# Patient Record
Sex: Male | Born: 1960 | Race: White | Hispanic: No | Marital: Married | Smoking: Never smoker
Health system: Southern US, Community
[De-identification: ages and names within clinical notes are randomized; demographics above are authoritative.]

## PROBLEM LIST (undated history)

## (undated) DIAGNOSIS — R269 Unspecified abnormalities of gait and mobility: Secondary | ICD-10-CM

## (undated) DIAGNOSIS — M545 Low back pain, unspecified: Secondary | ICD-10-CM

## (undated) DIAGNOSIS — M25551 Pain in right hip: Secondary | ICD-10-CM

## (undated) HISTORY — PX: HERNIA REPAIR: SHX51

## (undated) HISTORY — PX: FINGER SURGERY: SHX640

## (undated) HISTORY — DX: Pain in right hip: M25.551

## (undated) HISTORY — DX: Unspecified abnormalities of gait and mobility: R26.9

## (undated) HISTORY — PX: FOOT SURGERY: SHX648

## (undated) HISTORY — DX: Low back pain, unspecified: M54.50

---

## 2014-01-01 ENCOUNTER — Other Ambulatory Visit: Payer: Self-pay | Admitting: Unknown Physician Specialty

## 2014-01-01 DIAGNOSIS — R1032 Left lower quadrant pain: Secondary | ICD-10-CM

## 2014-11-05 ENCOUNTER — Emergency Department (INDEPENDENT_AMBULATORY_CARE_PROVIDER_SITE_OTHER)
Admission: EM | Admit: 2014-11-05 | Discharge: 2014-11-05 | Disposition: A | Discharge status: Home / Self Care | Payer: Worker's Compensation | Source: Home / Self Care | Attending: Family Medicine | Admitting: Family Medicine

## 2014-11-05 ENCOUNTER — Encounter: Payer: Self-pay | Admitting: Emergency Medicine

## 2014-11-05 DIAGNOSIS — S39012A Strain of muscle, fascia and tendon of lower back, initial encounter: Secondary | ICD-10-CM | POA: Diagnosis not present

## 2014-11-05 NOTE — ED Provider Notes (Signed)
CSN: [id number]     Arrival date & time 11/05/14  1131 History   First MD Initiated Contact with Patient 11/05/14 1138     Chief Complaint  Patient presents with  . 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  Pt states he felt a tightness and throbbing pain in his Right lower back. Pain is sharp on occasion with certain movements and positions, 8/10 at worst.  He has used Icy-hot and ibuprofen which has provided moderate relief. Pt is concerned he already feels pain as he states he does not normally get pain until a day or two after strenuous work. Denies hx of prior back surgeries. Denies any other injuries. Denies numbness or tingling in arms or legs. Denies change in bowel or bladder habits.  History reviewed. No pertinent past medical history. Past Surgical History  Procedure Laterality Date  . Hernia repair    . Finger surgery    . Foot surgery     Family History  Problem Relation Age of Onset  . Hyperlipidemia Father   . Dementia Father    Social History  Substance Use Topics  . Smoking status: Never Smoker   . Smokeless tobacco: None  . Alcohol Use: No    Review of Systems  Musculoskeletal: Positive for myalgias and back pain. Negative for arthralgias and gait problem.  Skin: Negative for rash and wound.  Neurological: Negative for weakness and numbness.    Allergies  Review of patient's allergies indicates no known allergies.  Home Medications   Prior to Admission medications   Medication Sig Start Date End Date Taking? Authorizing Provider  ibuprofen (ADVIL,MOTRIN) 200 MG tablet Take 200 mg by mouth every 6 (six) hours as needed.   Yes Historical Provider, MD   Meds Ordered and Administered this Visit  Medications - No data to  display  BP 121/71 mmHg  Pulse 55  Temp(Src) 97.9 F (36.6 C) (Oral)  Ht 5\' 9"  (1.753 m)  Wt 189 lb (85.73 kg)  BMI 27.90 kg/m2  SpO2 99% No data found.   Physical Exam  Constitutional: He is oriented to person, place, and time. He appears well-developed and well-nourished.  HENT:  Head: Normocephalic and atraumatic.  Eyes: EOM are normal.  Neck: Normal range of motion.  Cardiovascular: Normal rate.   Pulmonary/Chest: Effort normal.  Abdominal: Soft. There is no tenderness.  Musculoskeletal: Normal range of motion. He exhibits tenderness. He exhibits no edema.       Back:  No midline spinal tenderness. Tenderness to Right lower lumbar muscles. FROM upper and lower extremities bilaterally with 5/5 strength. Increased pain with full rotation to Left and Right at the waist. Increased pain with full flexion at the waist.  Neurological: He is alert and oriented to person, place, and time.  Normal gait. Normal sensation in all extremities.  Skin: Skin is warm and dry. No rash noted. No erythema.  Psychiatric: He has a normal mood and affect. His behavior is normal.  Nursing note and vitals reviewed.   ED Course  Procedures (including critical care time)  Labs Review Labs Reviewed - No data to display  Imaging Review No results found.   MDM   1. Low back strain, initial encounter    Pt c/o Right lower back pain after lifting a heavy object at work.  No red flag symptoms. No bony tenderness. Muscle strain. Pt states ibuprofen has provided moderate relief. Encouraged rest, ice, and gentle stretching as tolerated. Discouraged lifting over 25lbs and discouraged sudden bending or twisting at the waist. F/u in 2 days for recheck of symptoms. Work note provided for pt to be off until f/u on Friday 9/16. Pt verbalized understanding and agreement with tx plan.   Junius Finner, PA-C 11/05/14 [phone]

## 2014-11-05 NOTE — Discharge Instructions (Signed)
You may take 600-800mg  Ibuprofen every 6-8 hours for pain and swelling.  Please use ice on your lower back today 3-4 times a day for 15-20 minutes at a time.  You may alternate heat if that feels more comfortable. Avoid heavy lifting over 25 pounds. Avoid sudden rotation or bending at the waist to help prevent worsening your back pain. See below for further instructions.

## 2014-11-05 NOTE — ED Notes (Signed)
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

## 2014-11-07 ENCOUNTER — Emergency Department (INDEPENDENT_AMBULATORY_CARE_PROVIDER_SITE_OTHER)
Admission: EM | Admit: 2014-11-07 | Discharge: 2014-11-07 | Disposition: A | Discharge status: Home / Self Care | Payer: Worker's Compensation | Source: Home / Self Care | Attending: Family Medicine | Admitting: Family Medicine

## 2014-11-07 DIAGNOSIS — S39012D Strain of muscle, fascia and tendon of lower back, subsequent encounter: Secondary | ICD-10-CM

## 2014-11-07 NOTE — Discharge Instructions (Signed)
Apply ice pack for 20 to 30 minutes, 3 to 4 times daily  Continue until pain decreases.  Continue back exercises.  May begin swimming if tolerated.  Continue Ibuprofen , 3 to 4 tabs every 6 to 8 hours with food.    Back Pain, Adult Low back pain is very common. About 1 in 5 people have back pain.The cause of low back pain is rarely dangerous. The pain often gets better over time.About half of people with a sudden onset of back pain feel better in just 2 weeks. About 8 in 10 people feel better by 6 weeks.  CAUSES Some common causes of back pain include:  Strain of the muscles or ligaments supporting the spine.  Wear and tear (degeneration) of the spinal discs.  Arthritis.  Direct injury to the back. DIAGNOSIS Most of the time, the direct cause of low back pain is not known.However, back pain can be treated effectively even when the exact cause of the pain is unknown.Answering your caregiver's questions about your overall health and symptoms is one of the most accurate ways to make sure the cause of your pain is not dangerous. If your caregiver needs more information, he or she may order lab work or imaging tests (X-rays or MRIs).However, even if imaging tests show changes in your back, this usually does not require surgery. HOME CARE INSTRUCTIONS For many people, back pain returns.Since low back pain is rarely dangerous, it is often a condition that people can learn to [facility] their own.   Remain active. It is stressful on the back to sit or stand in one place. Do not sit, drive, or stand in one place for more than 30 minutes at a time. Take short walks on level surfaces as soon as pain allows.Try to increase the length of time you walk each day.  Do not stay in bed.Resting more than 1 or 2 days can delay your recovery.  Do not avoid exercise or work.Your body is made to move.It is not dangerous to be active, even though your back may hurt.Your back will likely heal faster if  you return to being active before your pain is gone.  Pay attention to your body when you bend and lift. Many people have less discomfortwhen lifting if they bend their knees, keep the load close to their bodies,and avoid twisting. Often, the most comfortable positions are those that put less stress on your recovering back.  Find a comfortable position to sleep. Use a firm mattress and lie on your side with your knees slightly bent. If you lie on your back, put a pillow under your knees.  Only take over-the-counter or prescription medicines as directed by your caregiver. Over-the-counter medicines to reduce pain and inflammation are often the most helpful.Your caregiver may prescribe muscle relaxant drugs.These medicines help dull your pain so you can more quickly return to your normal activities and healthy exercise.  Put ice on the injured area.  Put ice in a plastic bag.  Place a towel between your skin and the bag.  Leave the ice on for 15-20 minutes, 03-04 times a day for the first 2 to 3 days. After that, ice and heat may be alternated to reduce pain and spasms.  Ask your caregiver about trying back exercises and gentle massage. This may be of some benefit.  Avoid feeling anxious or stressed.Stress increases muscle tension and can worsen back pain.It is important to recognize when you are anxious or stressed and learn ways to  manage it.Exercise is a great option. SEEK MEDICAL CARE IF:  You have pain that is not relieved with rest or medicine.  You have pain that does not improve in 1 week.  You have new symptoms.  You are generally not feeling well. SEEK IMMEDIATE MEDICAL CARE IF:   You have pain that radiates from your back into your legs.  You develop new bowel or bladder control problems.  You have unusual weakness or numbness in your arms or legs.  You develop nausea or vomiting.  You develop abdominal pain.  You feel faint. Document Released: 02/07/2005  Document Revised: 08/09/2011 Document Reviewed: 06/11/2013 [facility] Patient Information 2015 [employer], [place]. This information is not intended to replace advice given to you by your health care provider. Make sure you discuss any questions you have with your health care provider.

## 2014-11-07 NOTE — ED Provider Notes (Signed)
CSN: [id number]     Arrival date & time 11/07/14  8295 History   First MD Initiated Contact with Patient 11/07/14 [phone]     Chief Complaint  Patient presents with  . Follow-up      HPI Comments: Patient returns for follow-up of low back pain.  He reports that there is less pain in his right lower back, although he has a constant dull throbbing discomfort.  He still has pain when he bends over to tie shoes or climb into his car.   He denies bowel or bladder dysfunction, and no saddle numbness. He is normally quite physically active and wonders if he can begin swimming.  Patient is a 54 y.o. male presenting with back pain. The history is provided by the patient.  Back Pain Location:  Lumbar spine Quality:  Aching Radiates to:  Does not radiate Pain severity:  Mild Pain is:  Worse during the day Onset quality:  Sudden Duration:  2 days Timing:  Constant Progression:  Improving Chronicity:  New Context: occupational injury   Relieved by:  NSAIDs Worsened by:  Bending Associated symptoms: no abdominal pain, no bladder incontinence, no bowel incontinence, no chest pain, no dysuria, no fever, no leg pain, no numbness, no paresthesias, no pelvic pain, no perianal numbness, no tingling and no weakness     History reviewed. No pertinent past medical history. Past Surgical History  Procedure Laterality Date  . Hernia repair    . Finger surgery    . Foot surgery     Family History  Problem Relation Age of Onset  . Hyperlipidemia Father   . Dementia Father   . Hyperlipidemia Sister    Social History  Substance Use Topics  . Smoking status: Never Smoker   . Smokeless tobacco: None  . Alcohol Use: No    Review of Systems  Constitutional: Negative for fever.  Cardiovascular: Negative for chest pain.  Gastrointestinal: Negative for abdominal pain and bowel incontinence.  Genitourinary: Negative for bladder incontinence, dysuria and pelvic pain.  Musculoskeletal: Positive for back  pain.  Neurological: Negative for tingling, weakness, numbness and paresthesias.  All other systems reviewed and are negative.   Allergies  Review of patient's allergies indicates no known allergies.  Home Medications   Prior to Admission medications   Medication Sig Start Date End Date Taking? Authorizing Provider  ibuprofen (ADVIL,MOTRIN) 200 MG tablet Take 200 mg by mouth every 6 (six) hours as needed.    Historical Provider, MD   Meds Ordered and Administered this Visit  Medications - No data to display  BP 120/70 mmHg  Pulse 66  Temp(Src) 98 F (36.7 C) (Oral)  Ht 5\' 9"  (1.753 m)  Wt 189 lb (85.73 kg)  BMI 27.90 kg/m2  SpO2 97% No data found.   Physical Exam  Constitutional: He is oriented to person, place, and time. He appears well-developed and well-nourished. No distress.  HENT:  Head: Normocephalic.  Mouth/Throat: Oropharynx is clear and moist.  Eyes: Conjunctivae are normal. Pupils are equal, round, and reactive to light.  Neck: Normal range of motion.  Cardiovascular: Normal heart sounds.   Pulmonary/Chest: Breath sounds normal.  Abdominal: There is no tenderness.  Musculoskeletal:       Lumbar back: He exhibits decreased range of motion and tenderness. He exhibits no bony tenderness and no swelling.       Back:  Back:   Can heel/toe walk and squat without difficulty.  Decreased forward flexion.  Tenderness in the midline  and bilateral paraspinous muscles from L1 to Sacral area as noted on diagram.    Straight leg raising test is negative.  Sitting knee extension test is negative.  Strength and sensation in the lower extremities is normal.  Patellar and achilles reflexes are normal     Neurological: He is alert and oriented to person, place, and time. He has normal reflexes.  Skin: Skin is warm and dry. No rash noted.  Nursing note and vitals reviewed.   ED Course  Procedures  None    MDM   1. Low back strain, subsequent encounter     Apply ice  pack for 20 to 30 minutes, 3 to 4 times daily  Continue until pain decreases.  Continue back exercises.  May begin swimming if tolerated.  Continue Ibuprofen , 3 to 4 tabs every 6 to 8 hours with food.   Work Restrictions:  Remain out of work through 11/12/14.  Return for follow-up on 11/12/14.    Lattie Haw, MD 11/11/14 1025

## 2014-11-07 NOTE — ED Notes (Signed)
Here Wednesday for workers comp.  Pain is less in lower right back, now a dull throb most of the time. Still has sharp pain when bending over to tie shoes or lifting leg to get in the car.   Some of the recommended exercises work others did not.

## 2014-11-12 ENCOUNTER — Encounter: Payer: Self-pay | Admitting: *Deleted

## 2014-11-12 ENCOUNTER — Emergency Department (INDEPENDENT_AMBULATORY_CARE_PROVIDER_SITE_OTHER)
Admission: EM | Admit: 2014-11-12 | Discharge: 2014-11-12 | Disposition: A | Discharge status: Home / Self Care | Payer: Worker's Compensation | Source: Home / Self Care | Attending: Family Medicine | Admitting: Family Medicine

## 2014-11-12 DIAGNOSIS — S39012D Strain of muscle, fascia and tendon of lower back, subsequent encounter: Secondary | ICD-10-CM | POA: Diagnosis not present

## 2014-11-12 MED ORDER — LUMBAR BACK BRACE/SUPPORT PAD MISC: Status: DC

## 2014-11-12 NOTE — Discharge Instructions (Signed)
Continue back exercises.  Continue daily walking on flat surfaces.  May continue swimming.  May continue to apply ice pack and/or heat.   Return to work 11/17/14 (then avoid lifting for one week).

## 2014-11-12 NOTE — ED Notes (Signed)
Joel Parsons is here today for a follow up on his low back strain workers comp injury from 11/05/14. He reports that he "feels better". Pain is a 1-2 out of 10.

## 2014-11-12 NOTE — ED Provider Notes (Signed)
CSN: [id number]     Arrival date & time 11/12/14  4098 History   First MD Initiated Contact with Patient 11/12/14 1012     Chief Complaint  Patient presents with  . Follow-up      HPI Comments: Patient returns for follow-up of low back strain (worker's compensation injury).  He reports less pain but still has limited mobility when trying to bend over.  No radicular symptoms.  He has started swimming without difficulty, continues daily back exercises, and walks daily.  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  Patient is a 54 y.o. male presenting with back pain. The history is provided by the patient.  Back Pain Location:  Lumbar spine Quality:  Aching Radiates to:  Does not radiate Pain severity:  Mild Pain is:  Worse during the day Onset quality:  Sudden Duration:  1 week Timing:  Intermittent Progression:  Improving Worsened by:  Bending Associated symptoms: no abdominal pain, no bladder incontinence, no bowel incontinence, no leg pain, no numbness, no paresthesias, no perianal numbness, no tingling, no weakness and no weight loss     History reviewed. No pertinent past medical history. Past Surgical History  Procedure Laterality Date  . Hernia repair    . Finger surgery    . Foot surgery     Family History  Problem Relation Age of Onset  . Hyperlipidemia Father   . Dementia Father   . Hyperlipidemia Sister    Social History  Substance Use Topics  . Smoking status: Never Smoker   . Smokeless tobacco: None  . Alcohol Use: No    Review of Systems  Constitutional: Negative for weight loss.  Gastrointestinal: Negative for abdominal pain and bowel incontinence.  Genitourinary: Negative for bladder incontinence.  Musculoskeletal: Positive for back pain.  Neurological: Negative for tingling, weakness, numbness and paresthesias.  All other systems reviewed and are negative.   Allergies  Review of patient's allergies  indicates no known allergies.  Home Medications   Prior to Admission medications   Medication Sig Start Date End Date Taking? Authorizing Provider  Elastic Bandages & Supports (LUMBAR BACK BRACE/SUPPORT PAD) MISC Wear daily wihile at work for one week (Worker's compensation injury) 11/12/14   Lattie Haw, MD  ibuprofen (ADVIL,MOTRIN) 200 MG tablet Take 200 mg by mouth every 6 (six) hours as needed.    Historical Provider, MD   Meds Ordered and Administered this Visit  Medications - No data to display  BP 105/66 mmHg  Pulse 66  Temp(Src) 98 F (36.7 C) (Oral)  Resp 16  Ht 5' 9.5" (1.765 m)  Wt 185 lb (83.915 kg)  BMI 26.94 kg/m2  SpO2 97% No data found.   Physical Exam  Constitutional: He is oriented to person, place, and time. He appears well-developed and well-nourished. No distress.  HENT:  Head: Normocephalic.  Eyes: Pupils are equal, round, and reactive to light.  Musculoskeletal:       Lumbar back: He exhibits decreased range of motion and tenderness.       Back:  Back:  Improved range of motion.  Mild tenderness in the midline and bilateral paraspinous muscles from L1 to Sacral area.  Straight leg raising test is negative.  Sitting knee extension test is negative.  Strength and sensation in the lower extremities is normal.  Patellar and achilles reflexes are normal   Neurological: He is alert and oriented to person, place, and time.  Skin: Skin  is warm and dry.  Nursing note and vitals reviewed.   ED Course  Procedures  None    MDM   1. Low back strain, subsequent encounter; improving.    Rx written for lumbar support. Continue back exercises.  Continue daily walking on flat surfaces.  May continue swimming.  May continue to apply ice pack and/or heat.   Return to work 11/17/14 (then avoid lifting for one week). Return for follow-up on 11/24/2014    Lattie Haw, MD 11/12/14 1047

## 2014-11-21 ENCOUNTER — Emergency Department (INDEPENDENT_AMBULATORY_CARE_PROVIDER_SITE_OTHER)
Admission: EM | Admit: 2014-11-21 | Discharge: 2014-11-21 | Disposition: A | Discharge status: Home / Self Care | Payer: Worker's Compensation | Source: Home / Self Care | Attending: Family Medicine | Admitting: Family Medicine

## 2014-11-21 DIAGNOSIS — S39012D Strain of muscle, fascia and tendon of lower back, subsequent encounter: Secondary | ICD-10-CM

## 2014-11-21 NOTE — ED Provider Notes (Signed)
CSN: [id number]     Arrival date & time 11/21/14  1119 History   First MD Initiated Contact with Patient 11/21/14 1152     Chief Complaint  Patient presents with  . Back Pain    lower   (Consider location/radiation/quality/duration/timing/severity/associated sxs/prior Treatment) HPI  Pt is a 54yo male sent to [facility] by his employer for recheck of lower back pain.  Pt was seen on 11/05/14 for initial injury, seen again on 11/07/14 and on 11/12/14. Pt returned to work with light duty, no heavy lifting, on Monday, 11/17/14.  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 He denies heavy lifting or any squatting but states his lower back started hurting again yesterday around 10AM.  Pain is aching and throbbing, 7/10 at worst. He did take ibuprofen yesterday with relief but was told to come be evaluated again today as his f/u with Employee Health was not scheduled until Monday, 11/24/14.  He did not take ibuprofen today as he wanted to "see how he did"  He has been alternating ice and heat and also does gentle stretching and home exercises.  He also bought a lumbar support brace he has been using intermittently as instructed by Dr. Cathren Harsh. Denies new injuries. Denies numbness or tingling in groin or legs. Denies change in bowel or bladder habits.  Pt does note his father had several lumbar discs fussed when he was about same age as the pt. Pt denies prior back surgeries but is concerned he has a herniated disc.   History reviewed. No pertinent past medical history. Past Surgical History  Procedure Laterality Date  . Hernia repair    . Finger surgery    . Foot surgery     Family History  Problem Relation Age of Onset  . Hyperlipidemia Father   . Dementia Father   . Hyperlipidemia Sister    Social History  Substance Use Topics  . Smoking status: Never Smoker   . Smokeless tobacco: None  . Alcohol Use: No    Review of Systems  Genitourinary:  Negative for dysuria and hematuria.  Musculoskeletal: Positive for myalgias and back pain. Negative for gait problem, neck pain and neck stiffness.  Skin: Negative for color change, rash and wound.  Neurological: Negative for weakness and numbness.    Allergies  Review of patient's allergies indicates no known allergies.  Home Medications   Prior to Admission medications   Medication Sig Start Date End Date Taking? Authorizing Provider  Elastic Bandages & Supports (LUMBAR BACK BRACE/SUPPORT PAD) MISC Wear daily wihile at work for one week (Worker's compensation injury) 11/12/14   Lattie Haw, MD  ibuprofen (ADVIL,MOTRIN) 200 MG tablet Take 200 mg by mouth every 6 (six) hours as needed.    Historical Provider, MD   Meds Ordered and Administered this Visit  Medications - No data to display  BP 117/72 mmHg  Pulse 60  Temp(Src) 98.2 F (36.8 C) (Oral)  Ht 5' 9.5" (1.765 m)  Wt 188 lb 6.4 oz (85.458 kg)  BMI 27.43 kg/m2  SpO2 97% No data found.   Physical Exam  Constitutional: He is oriented to person, place, and time. He appears well-developed and well-nourished.  HENT:  Head: Normocephalic and atraumatic.  Eyes: EOM are normal.  Neck: Normal range of motion.  Cardiovascular: Normal rate.   Pulmonary/Chest: Effort normal.  Musculoskeletal: Normal range of motion. He exhibits tenderness. He exhibits no edema.  No  midline spinal tenderness. Full ROM upper and lower extremities bilaterally with 5/5 strength bilaterally. Mild tenderness to Right lower lumbar muscles.  Neurological: He is alert and oriented to person, place, and time.  Reflex Scores:      Patellar reflexes are 2+ on the right side and 2+ on the left side.      Achilles reflexes are 2+ on the right side and 2+ on the left side. Normal gait.  Skin: Skin is warm and dry.  Psychiatric: He has a normal mood and affect. His behavior is normal.  Nursing note and vitals reviewed.   ED Course  Procedures  (including critical care time)  Labs Review Labs Reviewed - No data to display  Imaging Review No results found.    MDM   1. Low back strain, subsequent encounter    Pt c/o continued lower back pain secondary to initial injury on 11/05/14 at work.  He has been using conservative treatment including ice, heat, ibuprofen, lumbar brace, and home exercises. Pt does have mild tenderness in lumbar muscles today. No red flag symptoms. Pain is 1/10 at rest but 7/10 at worst while at work yesterday. Work note provided for pt to be off work today, he is off this weekend. Encouraged him to f/u with Employee health as previously planned for 11/24/14 for further evaluation and treatment. Patient verbalized understanding and agreement with treatment plan.   Junius Finner, PA-C 11/21/14 1203

## 2014-11-21 NOTE — ED Notes (Signed)
Still having back discomfort from previous injury.   Workers comp, stated that went back to work Monday with the restriction of no heavy lifting, Monday, Tuesday, and Wednesday, were good.  Back started hurting Thursday around 10-11.  Lower back pain, throbbing but not constant.

## 2016-11-07 ENCOUNTER — Ambulatory Visit (INDEPENDENT_AMBULATORY_CARE_PROVIDER_SITE_OTHER): Payer: Self-pay

## 2016-11-07 ENCOUNTER — Other Ambulatory Visit: Payer: Self-pay | Admitting: Emergency Medicine

## 2016-11-07 DIAGNOSIS — M795 Residual foreign body in soft tissue: Secondary | ICD-10-CM

## 2016-11-07 DIAGNOSIS — X58XXXA Exposure to other specified factors, initial encounter: Secondary | ICD-10-CM

## 2016-11-07 DIAGNOSIS — S81812A Laceration without foreign body, left lower leg, initial encounter: Secondary | ICD-10-CM

## 2017-04-25 ENCOUNTER — Other Ambulatory Visit: Payer: Self-pay | Admitting: Unknown Physician Specialty

## 2017-04-25 DIAGNOSIS — R109 Unspecified abdominal pain: Secondary | ICD-10-CM

## 2017-04-27 ENCOUNTER — Ambulatory Visit (INDEPENDENT_AMBULATORY_CARE_PROVIDER_SITE_OTHER): Payer: PRIVATE HEALTH INSURANCE

## 2017-04-27 DIAGNOSIS — R109 Unspecified abdominal pain: Secondary | ICD-10-CM

## 2018-12-11 ENCOUNTER — Ambulatory Visit (INDEPENDENT_AMBULATORY_CARE_PROVIDER_SITE_OTHER): Payer: PRIVATE HEALTH INSURANCE

## 2018-12-11 ENCOUNTER — Other Ambulatory Visit: Payer: Self-pay | Admitting: Unknown Physician Specialty

## 2018-12-11 ENCOUNTER — Other Ambulatory Visit: Payer: Self-pay

## 2018-12-11 DIAGNOSIS — R52 Pain, unspecified: Secondary | ICD-10-CM

## 2021-02-15 IMAGING — DX DG CERVICAL SPINE COMPLETE 4+V
6 series · 6 of 6 positions shown · non-contrast
Comparison: None.

CLINICAL DATA: Neck pain increasing over the past several months

EXAM:
CERVICAL SPINE - COMPLETE 4+ VIEW

[c-spine lat]
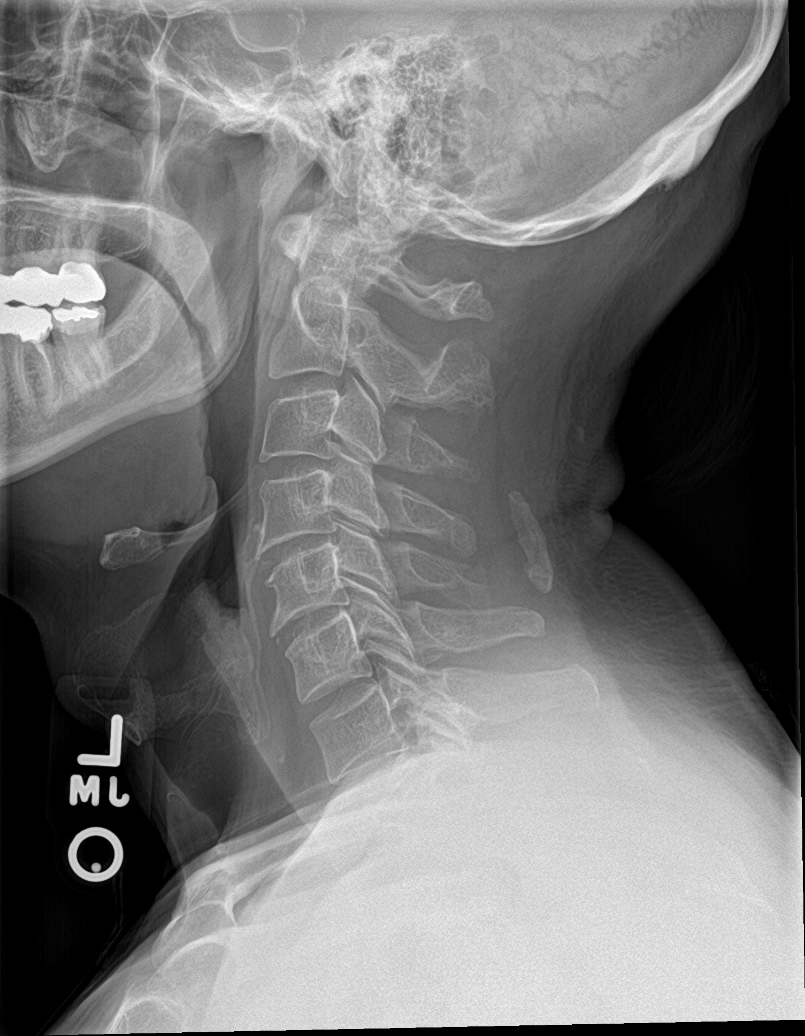

[c-spine obl (1 of 2)]
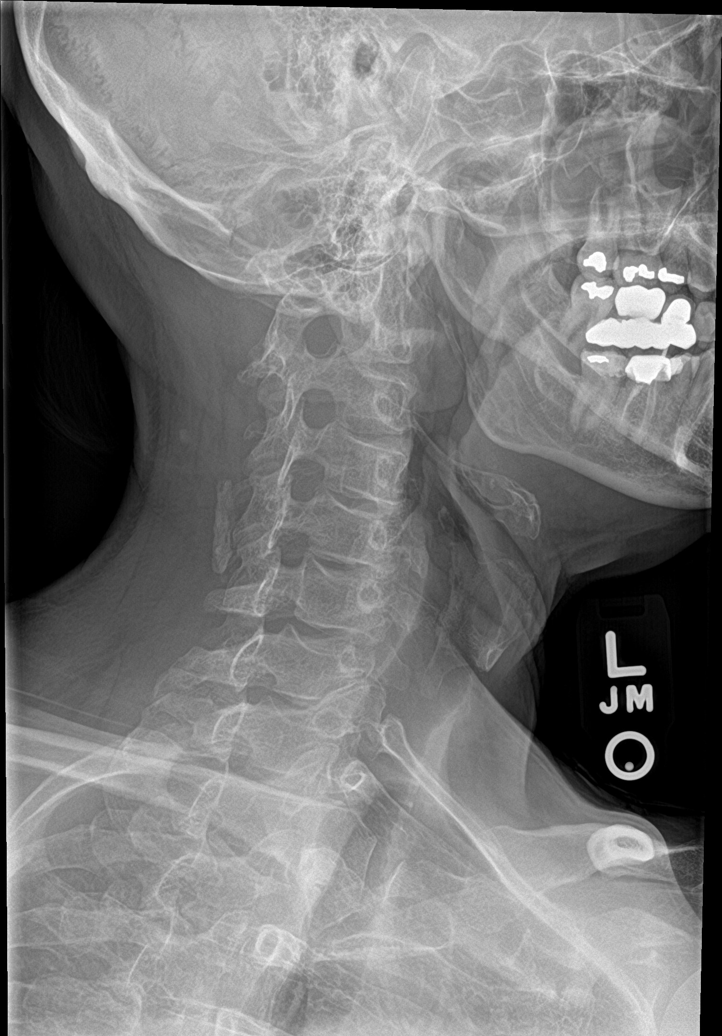

[c-spine obl (2 of 2)]
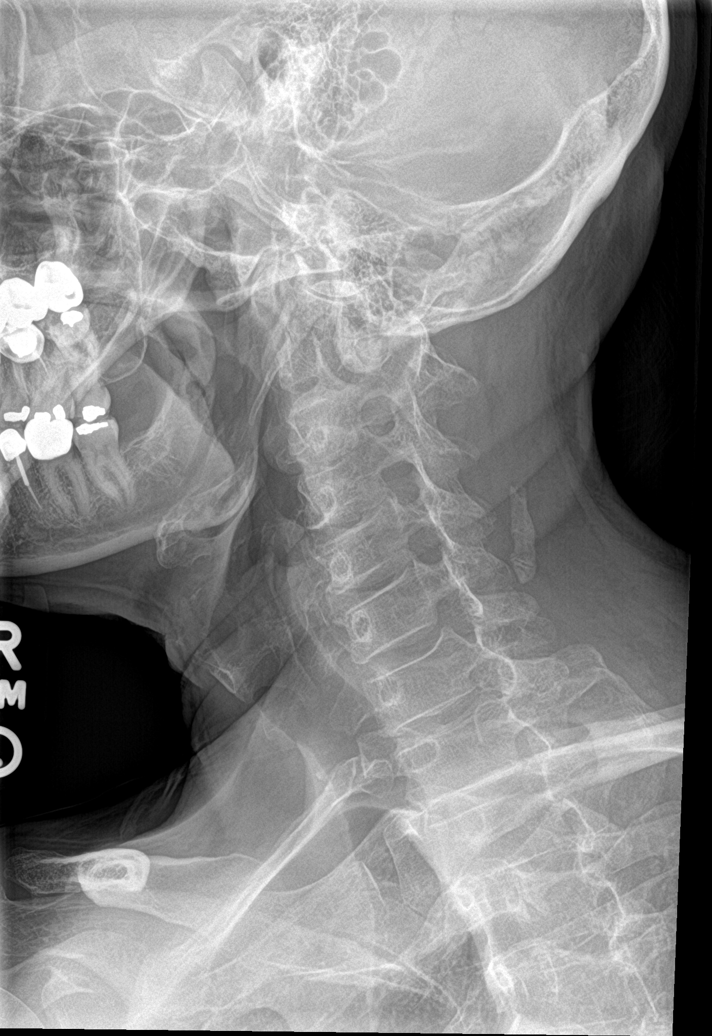

[c-spine ap]
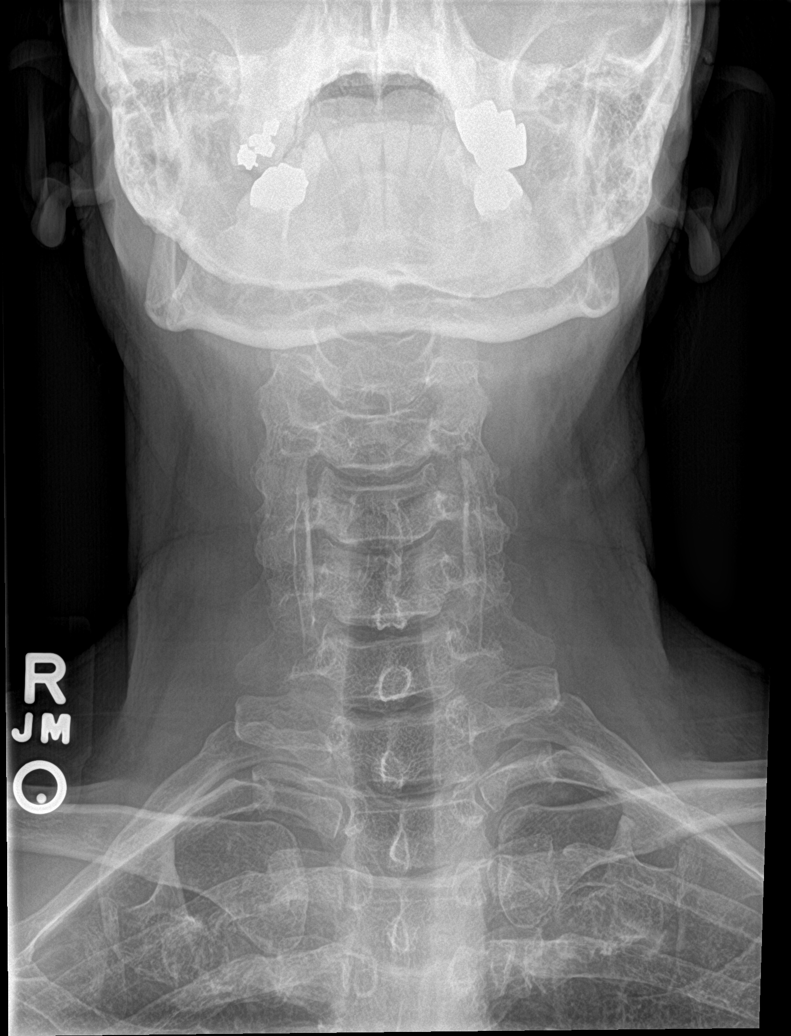

[c-spine open mouth]
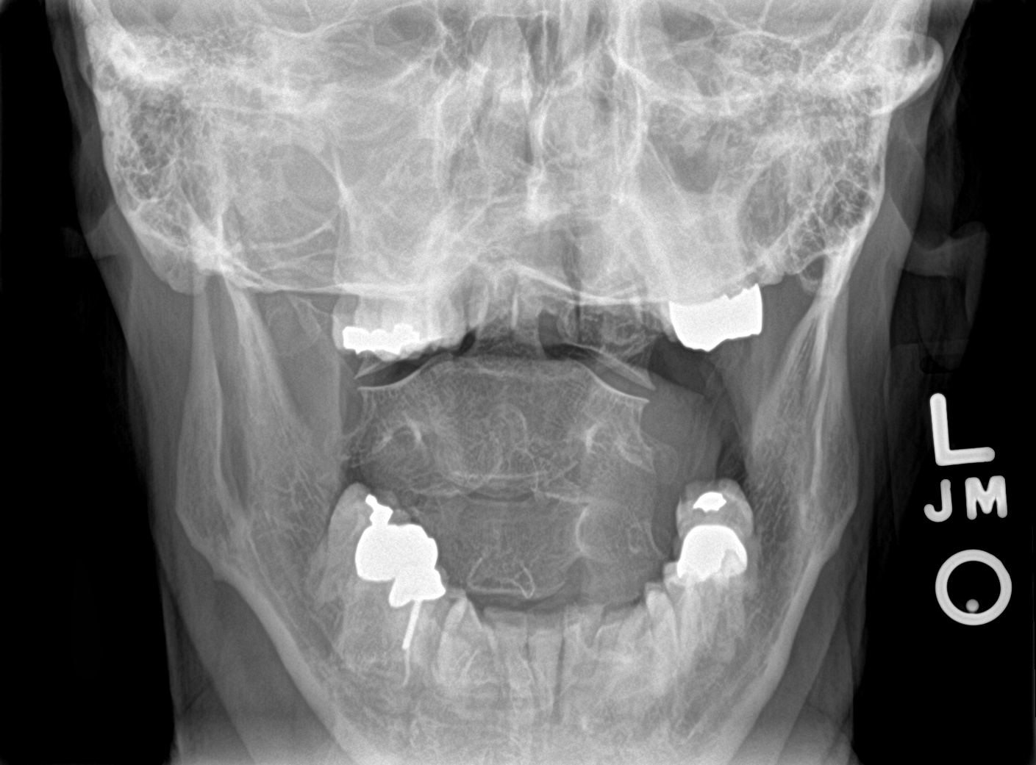

[[person_name]]
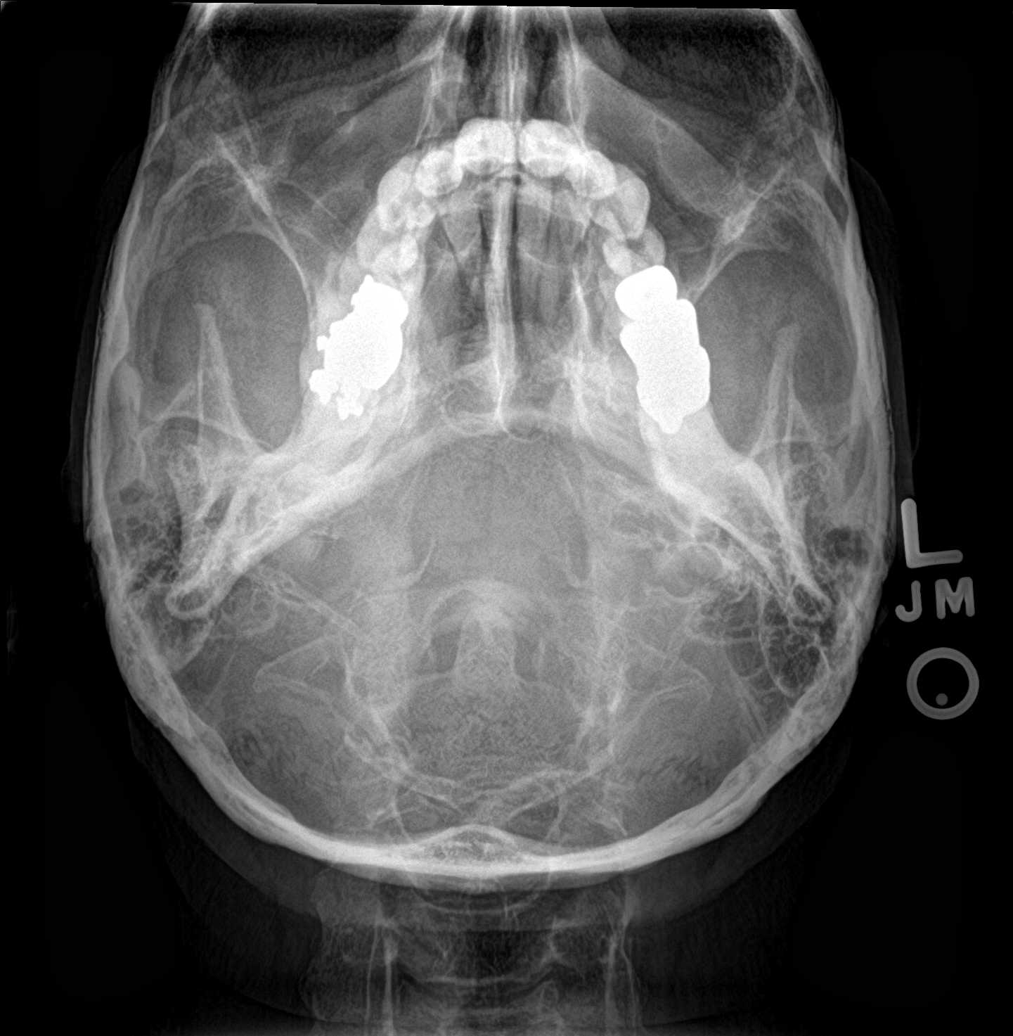

[6 of 6 positions shown; findings below may reference images not displayed]

FINDINGS: Seven cervical segments are well visualized. Vertebral body height
is well maintained. Mild osteophytic changes are noted. No
prevertebral soft tissues changes are seen. No neural foraminal
narrowing is noted. Mild facet hypertrophic changes are seen. The
odontoid is within normal limits.
IMPRESSION: Mild degenerative change without acute abnormality.

## 2022-02-19 ENCOUNTER — Ambulatory Visit
Admission: EM | Admit: 2022-02-19 | Discharge: 2022-02-19 | Disposition: A | Discharge status: Home / Self Care | Payer: BC Managed Care – PPO | Attending: Family Medicine | Admitting: Family Medicine

## 2022-02-19 ENCOUNTER — Other Ambulatory Visit: Payer: Self-pay

## 2022-02-19 ENCOUNTER — Ambulatory Visit: Admit: 2022-02-19 | Discharge status: Absent without leave | Payer: PRIVATE HEALTH INSURANCE

## 2022-02-19 DIAGNOSIS — S39012A Strain of muscle, fascia and tendon of lower back, initial encounter: Secondary | ICD-10-CM | POA: Diagnosis not present

## 2022-02-19 LAB — POCT URINALYSIS DIP (MANUAL ENTRY)
Bilirubin, UA: NEGATIVE
Blood, UA: NEGATIVE
Glucose, UA: NEGATIVE mg/dL
Leukocytes, UA: NEGATIVE
Nitrite, UA: NEGATIVE
Protein Ur, POC: NEGATIVE mg/dL
Spec Grav, UA: 1.015 (ref 1.010–1.025)
Urobilinogen, UA: 0.2 E.U./dL
pH, UA: 6.5 (ref 5.0–8.0)

## 2022-02-19 NOTE — Discharge Instructions (Addendum)
Advised patient may take OTC Ibuprofen 600 to 800 mg once daily, as needed for the next 2 to 3 days for lumbar strain.  Advised if symptoms worsen and/or unresolved please follow-up with PCP for further evaluation.

## 2022-02-19 NOTE — ED Provider Notes (Signed)
[facility]    CSN: [id number] Arrival date & time: 02/19/22  1116      History   Chief Complaint Chief Complaint  Patient presents with   Back Pain    Lower LT    HPI Joel Parsons is a 61 y.o. male.   HPI 61 year old male presents with left-sided back pain since yesterday radiates towards front denies any nausea and vomiting.  Patient is concerned for kidney stone.  History reviewed. No pertinent past medical history.  There are no problems to display for this patient.   Past Surgical History:  Procedure Laterality Date   FINGER SURGERY     FOOT SURGERY     HERNIA REPAIR         Home Medications    Prior to Admission medications   Medication Sig Start Date End Date Taking? Authorizing Provider  Elastic Bandages & Supports (LUMBAR BACK BRACE/SUPPORT PAD) MISC Wear daily wihile at work for one week (Worker's compensation injury) 11/12/14   Lattie Haw, MD  ibuprofen (ADVIL,MOTRIN) 200 MG tablet Take 200 mg by mouth every 6 (six) hours as needed.    [provider]    Family History Family History  Problem Relation Age of Onset   Hyperlipidemia Father    Dementia Father    Hyperlipidemia Sister     Social History Social History   Tobacco Use   Smoking status: Never  Substance Use Topics   Alcohol use: No   Drug use: No     Allergies   Patient has no known allergies.   Review of Systems Review of Systems   Physical Exam Triage Vital Signs ED Triage Vitals  Enc Vitals Group     BP 02/19/22 1323 114/75     Pulse Rate 02/19/22 1323 89     Resp 02/19/22 1323 17     Temp 02/19/22 1323 98.1 F (36.7 C)     Temp Source 02/19/22 1323 Oral     SpO2 02/19/22 1323 98 %     Weight --      Height --      Head Circumference --      Peak Flow --      Pain Score 02/19/22 1325 5     Pain Loc --      Pain Edu? --      Excl. in GC? --    No data found.  Updated Vital Signs BP 114/75 (BP Location: Right Arm)   Pulse  89   Temp 98.1 F (36.7 C) (Oral)   Resp 17   SpO2 98%   Visual Acuity Right Eye Distance:   Left Eye Distance:   Bilateral Distance:    Right Eye Near:   Left Eye Near:    Bilateral Near:     Physical Exam Vitals and nursing note reviewed.  Constitutional:      Appearance: Normal appearance. He is normal weight.  HENT:     Head: Normocephalic and atraumatic.     Right Ear: Tympanic membrane, ear canal and external ear normal.     Left Ear: Tympanic membrane, ear canal and external ear normal.     Nose: Nose normal.     Mouth/Throat:     Mouth: Mucous membranes are moist.     Pharynx: Oropharynx is clear.  Eyes:     Extraocular Movements: Extraocular movements intact.     Conjunctiva/sclera: Conjunctivae normal.     Pupils: Pupils are equal, round, and reactive to  light.  Cardiovascular:     Rate and Rhythm: Normal rate and regular rhythm.     Pulses: Normal pulses.     Heart sounds: Normal heart sounds.  Pulmonary:     Effort: Pulmonary effort is normal.     Breath sounds: Normal breath sounds. No wheezing, rhonchi or rales.  Abdominal:     General: Bowel sounds are normal.     Tenderness: There is no right CVA tenderness or left CVA tenderness.  Musculoskeletal:        General: Normal range of motion.     Cervical back: Normal range of motion and neck supple.     Comments: Mildly TTP over left sided inferior spinal erector groups, no deformity noted  Skin:    General: Skin is warm and dry.  Neurological:     General: No focal deficit present.     Mental Status: He is alert and oriented to person, place, and time.      UC Treatments / Results  Labs (all labs ordered are listed, but only abnormal results are displayed) Labs Reviewed  POCT URINALYSIS DIP (MANUAL ENTRY) - Abnormal; Notable for the following components:      Result Value   Ketones, POC UA trace (5) (*)    All other components within normal limits    EKG   Radiology No results  found.  Procedures Procedures (including critical care time)  Medications Ordered in UC Medications - No data to display  Initial Impression / Assessment and Plan / UC Course  I have reviewed the triage vital signs and the nursing notes.  Pertinent labs & imaging results that were available during my care of the patient were reviewed by me and considered in my medical decision making (see chart for details).     MDM: 1.  Lumbar strain, initial encounter. Advised patient may take OTC Ibuprofen 600 to 800 mg once daily, as needed for the next 2 to 3 days for lumbar strain.  More than likely exacerbated by several mile hike at [facility] on Thursday, 02/17/2022.  Advised if symptoms worsen and/or unresolved please follow-up with PCP for further evaluation. Final Clinical Impressions(s) / UC Diagnoses   Final diagnoses:  Strain of lumbar region, initial encounter     Discharge Instructions      Advised patient may take OTC Ibuprofen 600 to 800 mg once daily, as needed for the next 2 to 3 days for lumbar strain.  Advised if symptoms worsen and/or unresolved please follow-up with PCP for further evaluation.     ED Prescriptions   None    PDMP not reviewed this encounter.   Trevor Iha, FNP 02/19/22 1402

## 2022-02-19 NOTE — ED Triage Notes (Signed)
Pt c/o LT sided back pain since yesterday. Radiates towards front. Denies n/v/d. Tylenol prn. Last dose 8am.

## 2024-01-03 DIAGNOSIS — R269 Unspecified abnormalities of gait and mobility: Secondary | ICD-10-CM | POA: Insufficient documentation

## 2024-01-31 DIAGNOSIS — M545 Low back pain, unspecified: Secondary | ICD-10-CM | POA: Insufficient documentation

## 2024-01-31 DIAGNOSIS — M25551 Pain in right hip: Secondary | ICD-10-CM | POA: Insufficient documentation

## 2024-02-14 ENCOUNTER — Encounter: Payer: Self-pay | Admitting: *Deleted

## 2024-02-19 ENCOUNTER — Ambulatory Visit: Admitting: Neurology

## 2024-02-19 ENCOUNTER — Encounter: Payer: Self-pay | Admitting: Neurology

## 2024-02-19 VITALS — BP 145/86 | Ht 69.0 in | Wt 194.5 lb

## 2024-02-19 DIAGNOSIS — R3915 Urgency of urination: Secondary | ICD-10-CM | POA: Insufficient documentation

## 2024-02-19 DIAGNOSIS — R269 Unspecified abnormalities of gait and mobility: Secondary | ICD-10-CM | POA: Diagnosis not present

## 2024-02-19 DIAGNOSIS — W19XXXA Unspecified fall, initial encounter: Secondary | ICD-10-CM | POA: Insufficient documentation

## 2024-02-19 DIAGNOSIS — W19XXXS Unspecified fall, sequela: Secondary | ICD-10-CM | POA: Diagnosis not present

## 2024-02-19 NOTE — Progress Notes (Signed)
 "  Chief Complaint  Patient presents with   RM14/GAIT ISSUES    Pt is here with his Wife. Pt states that he took a fall in June and since then he has been having gait abnormality.       ASSESSMENT AND PLAN  Joel Parsons is a 63 y.o. male   Acute onset gait abnormality, urinary urgency, slowly getting worse since he fell in June 2025  Mild bilateral shoulder abduction, hip flexion, toe extension/flexion weakness right worse than left  Hyperreflexia, bilateral Hoffmann, Babinski sign  Most worrisome for cervical pathology, stat MRI cervical  Follow-up depend on MRI result  DIAGNOSTIC DATA (LABS, IMAGING, TESTING) - I reviewed patient records, labs, notes, testing and imaging myself where available.   MEDICAL HISTORY:  Joel Parsons, is a 63 year old male, accompanied by his wife seen in request by his primary care from [facility], [place] for evaluation of gait abnormality, initial evaluation was on February 19, 2024  History is obtained from the patient and review of electronic medical records. I personally reviewed pertinent available imaging films in PACS.   PMHx of  Plantar fascitis surgery in 2004,   He is a high school teacher, exercise regularly, denies previous gait abnormality, had acute onset of gait difficulty since he fell on August 20, 2023, after workout, he stretched his right leg on the post, lost the balance, fell backwards landed on his right side, he felt a jolt of shooting pain along his spine, he was able to get up, but noticed mild gait abnormality since then, leg would not go as fast, right side is worse  Over past few months, his symptoms continue to get worse, denies sensory loss, denied significant neck or low back pain, noticed slow worsening urinary urgency, denied bowel incontinence, also develop muscle fasciculation of upper or lower extremity, mild right hand clumsiness, denies swallowing difficulty visual loss    PHYSICAL EXAM:   Vitals:    02/19/24 1314  BP: (!) 145/86  Weight: 194 lb 8 oz (88.2 kg)  Height: 5' 9 (1.753 m)     Body mass index is 28.72 kg/m.  PHYSICAL EXAMNIATION:  Gen: NAD, conversant, well nourised, well groomed                     Cardiovascular: Regular rate rhythm, no peripheral edema, warm, nontender. Eyes: Conjunctivae clear without exudates or hemorrhage Neck: Supple, no carotid bruits. Pulmonary: Clear to auscultation bilaterally   NEUROLOGICAL EXAM:  MENTAL STATUS: Speech/cognition: Awake, alert, oriented to history taking and casual conversation CRANIAL NERVES: CN II: Visual fields are full to confrontation. Pupils are round equal and briskly reactive to light. CN III, IV, VI: extraocular movement are normal. No ptosis. CN V: Facial sensation is intact to light touch CN VII: Face is symmetric with normal eye closure  CN VIII: Hearing is normal to causal conversation. CN IX, X: Phonation is normal. CN XI: Head turning and shoulder shrug are intact  MOTOR: Mild bilateral shoulder abduction, external rotation weakness right worse than left, visible right proximal upper extremity muscle fasciculation,  Mild bilateral hip flexion weakness right worse than left Mild bilateral toe flexion extension weakness right worse than left  REFLEXES: Reflexes are 3 and symmetric at the biceps, triceps, knees, and ankles. Plantar responses are extensor bilaterally, bilateral Hoffmann sign  SENSORY: Intact to light touch, pinprick and vibratory sensation are intact in fingers and toes.  COORDINATION: There is no trunk or limb dysmetria noted.  GAIT/STANCE: Multiple effort to get up from seated position arm crossed, stiff, profound gait abnormality  REVIEW OF SYSTEMS:  Full 14 system review of systems performed and notable only for as above All other review of systems were negative.   ALLERGIES: Allergies[1]  HOME MEDICATIONS: Current Outpatient Medications  Medication Sig Dispense Refill    SODIUM FLUORIDE 5000 PPM 1.1 % PSTE      Vitamin D, Ergocalciferol, (DRISDOL) 1.25 MG (50000 UNIT) CAPS capsule Take 50,000 Units by mouth every 7 (seven) days.     Elastic Bandages & Supports (LUMBAR BACK BRACE/SUPPORT PAD) MISC Wear daily wihile at work for one week (Worker's compensation injury) (Patient not taking: Reported on 02/19/2024) 1 each 0   ibuprofen (ADVIL,MOTRIN) 200 MG tablet Take 200 mg by mouth every 6 (six) hours as needed. (Patient not taking: Reported on 02/19/2024)     No current facility-administered medications for this visit.    PAST MEDICAL HISTORY: Past Medical History:  Diagnosis Date   Gait abnormality    Lower back pain    Right hip pain     PAST SURGICAL HISTORY: Past Surgical History:  Procedure Laterality Date   FINGER SURGERY     FOOT SURGERY     HERNIA REPAIR      FAMILY HISTORY: Family History  Problem Relation Age of Onset   Hyperlipidemia Father    Dementia Father    Hyperlipidemia Sister     SOCIAL HISTORY: Social History   Socioeconomic History   Marital status: Married    Spouse name: Not on file   Number of children: Not on file   Years of education: Not on file   Highest education level: Not on file  Occupational History   Not on file  Tobacco Use   Smoking status: Never   Smokeless tobacco: Not on file  Substance and Sexual Activity   Alcohol use: No   Drug use: No   Sexual activity: Not on file  Other Topics Concern   Not on file  Social History Narrative   Not on file   Social Drivers of Health   Tobacco Use: Unknown (02/19/2024)   Patient History    Smoking Tobacco Use: Never    Smokeless Tobacco Use: Unknown    Passive Exposure: Not on file  Financial Resource Strain: Low Risk (12/01/2023)   Received from [facility]   Overall Financial Resource Strain (CARDIA)    How hard is it for you to pay for the very basics like food, housing, medical care, and heating?: Not very hard  Food Insecurity: No Food  Insecurity (12/01/2023)   Received from [facility]   Epic    Within the past 12 months, you worried that your food would run out before you got the money to buy more.: Never true    Within the past 12 months, the food you bought just didn't last and you didn't have money to get more.: Never true  Transportation Needs: No Transportation Needs (12/01/2023)   Received from [facility]    In the past 12 months, has lack of transportation kept you from medical appointments or from getting medications?: No    In the past 12 months, has lack of transportation kept you from meetings, work, or from getting things needed for daily living?: No  Physical Activity: Sufficiently Active (12/01/2023)   Received from [facility]   Exercise Vital Sign    On average, how many days per week do you  engage in moderate to strenuous exercise (like a brisk walk)?: 6 days    On average, how many minutes do you engage in exercise at this level?: 60 min  Stress: No Stress Concern Present (12/01/2023)   Received from [facility] - Occupational Stress Questionnaire    Do you feel stress - tense, restless, nervous, or anxious, or unable to sleep at night because your mind is troubled all the time - these days?: Not at all  Social Connections: Socially Integrated (12/01/2023)   Received from [facility]   Social Network    How would you rate your social network (family, work, friends)?: Good participation with social networks  Intimate Partner Violence: Not At Risk (12/01/2023)   Received from [facility]   HITS    Over the last 12 months how often did your partner physically hurt you?: Never    Over the last 12 months how often did your partner insult you or talk down to you?: Never    Over the last 12 months how often did your partner threaten you with physical harm?: Never    Over the last 12 months how often did your partner scream or curse at you?:  Never  Depression (PHQ2-9): Not on file  Alcohol Screen: Not on file  Housing: Low Risk (12/01/2023)   Received from [facility]    In the last 12 months, was there a time when you were not able to pay the mortgage or rent on time?: No    In the past 12 months, how many times have you moved where you were living?: 0    At any time in the past 12 months, were you homeless or living in a shelter (including now)?: No  Utilities: Not At Risk (12/01/2023)   Received from [facility]    In the past 12 months has the electric, gas, oil, or water company threatened to shut off services in your home?: No  Health Literacy: Not on file      Modena Callander, M.D. Ph.D.  [facility] [address] [place], [place] [address] Ph: [phone] Fax: [phone]  CC:  Donah Penne LABOR, PA-C [address] [place],  [place] [address]  Marygrace [place] LABOR, PA-C       [1] No Known Allergies  "

## 2024-02-20 ENCOUNTER — Other Ambulatory Visit

## 2024-02-20 ENCOUNTER — Telehealth: Payer: Self-pay | Admitting: Neurology

## 2024-02-20 DIAGNOSIS — R3915 Urgency of urination: Secondary | ICD-10-CM

## 2024-02-20 DIAGNOSIS — R269 Unspecified abnormalities of gait and mobility: Secondary | ICD-10-CM

## 2024-02-20 DIAGNOSIS — W19XXXS Unspecified fall, sequela: Secondary | ICD-10-CM | POA: Diagnosis not present

## 2024-02-20 NOTE — Telephone Encounter (Signed)
 Spoke to pt and informed him that Dr. Onita wants to repeat the NCV/EMG

## 2024-02-20 NOTE — Addendum Note (Signed)
 Addended by: Vishnu Moeller on: 02/20/2024 01:02 PM   Modules accepted: Orders

## 2024-02-20 NOTE — Telephone Encounter (Signed)
 Called pt and scheduled MRI for 12/31 @ 8am and scheduled NCV/EMG W/ Dr.Yan for Jan 9th @ 8:30am

## 2024-02-20 NOTE — Telephone Encounter (Addendum)
 I called patient, MRI of cervical spine only showed mild degenerative changes, there was no significant spinal cord or nerve root compression to explain his gait abnormality  Ordered more extensive neuroimaging study, also EMG nerve conduction study  Please put him on schedule for EMG/NCS for Jan 9th,   Orders Placed This Encounter  Procedures   MR BRAIN WO CONTRAST   MR THORACIC SPINE WO CONTRAST   NCV with EMG(electromyography)       IMPRESSION: This MRI of the cervical spine without contrast shows the following: The spinal cord appears normal. Mild mid to lower cervical spine degenerative changes as detailed above that do not lead to critical foraminal narrowing.  There is no spinal stenosis or nerve root compression.  This extent of degenerative changes typical for age.

## 2024-02-20 NOTE — Telephone Encounter (Signed)
 NCS scheduled on 03/01/24

## 2024-02-21 ENCOUNTER — Ambulatory Visit

## 2024-02-21 DIAGNOSIS — R3915 Urgency of urination: Secondary | ICD-10-CM

## 2024-02-21 DIAGNOSIS — W19XXXS Unspecified fall, sequela: Secondary | ICD-10-CM

## 2024-02-21 DIAGNOSIS — R269 Unspecified abnormalities of gait and mobility: Secondary | ICD-10-CM | POA: Diagnosis not present

## 2024-02-22 ENCOUNTER — Encounter: Payer: Self-pay | Admitting: Neurology

## 2024-02-22 DIAGNOSIS — W19XXXS Unspecified fall, sequela: Secondary | ICD-10-CM

## 2024-02-22 DIAGNOSIS — R269 Unspecified abnormalities of gait and mobility: Secondary | ICD-10-CM

## 2024-02-22 DIAGNOSIS — R3915 Urgency of urination: Secondary | ICD-10-CM

## 2024-02-26 ENCOUNTER — Ambulatory Visit: Payer: Self-pay | Admitting: Neurology

## 2024-02-26 NOTE — Telephone Encounter (Signed)
 I talked with the patient, he complains of low back pain since he fell in June, continue complains of gait abnormality  MRI of the brain, cervical, thoracic spine was nonrevealing, significant upper motor neuron signs on physical exam  Will go ahead proceed with MRI of lumbar with without contrast  Schedule for EMG nerve conduction study January 9

## 2024-02-27 ENCOUNTER — Ambulatory Visit: Payer: Self-pay | Admitting: Neurology

## 2024-02-27 ENCOUNTER — Ambulatory Visit

## 2024-02-27 DIAGNOSIS — R3915 Urgency of urination: Secondary | ICD-10-CM

## 2024-02-27 DIAGNOSIS — W19XXXS Unspecified fall, sequela: Secondary | ICD-10-CM | POA: Diagnosis not present

## 2024-02-27 DIAGNOSIS — R269 Unspecified abnormalities of gait and mobility: Secondary | ICD-10-CM | POA: Diagnosis not present

## 2024-02-27 MED ORDER — GADOBENATE DIMEGLUMINE 529 MG/ML IV SOLN
18.0000 mL | Freq: Once | INTRAVENOUS | Status: AC | PRN
  Administered 2024-02-27: 18 mL via INTRAVENOUS

## 2024-03-01 ENCOUNTER — Ambulatory Visit: Admitting: Neurology

## 2024-03-01 ENCOUNTER — Encounter: Payer: Self-pay | Admitting: Neurology

## 2024-03-01 VITALS — BP 126/81 | HR 75 | Resp 15 | Ht 70.0 in

## 2024-03-01 DIAGNOSIS — G1220 Motor neuron disease, unspecified: Secondary | ICD-10-CM | POA: Insufficient documentation

## 2024-03-01 DIAGNOSIS — W19XXXS Unspecified fall, sequela: Secondary | ICD-10-CM

## 2024-03-01 DIAGNOSIS — R3915 Urgency of urination: Secondary | ICD-10-CM | POA: Diagnosis not present

## 2024-03-01 DIAGNOSIS — R269 Unspecified abnormalities of gait and mobility: Secondary | ICD-10-CM

## 2024-03-01 NOTE — Progress Notes (Signed)
 "  Chief Complaint  Patient presents with   ncs emg     Emgrm3, alone, Pt is well and ready for ncs/emg      ASSESSMENT AND PLAN  Joel Parsons is a 64 y.o. male   Reported acute onset gait abnormality,  slowly getting worse since he fell in June 2025  MRI of neuraxis including brain and spine showed no structural lesion to explain his significant difficulties  Combination of upper and lower motor neuron signs on exam  EMG nerve conduction study March 01, 2024 showed active/chronic neuropathic changes involving right cervical, lumbosacral myotomes, with evidence of active denervation, fasciculations in limb muscles, mild chronic neuropathic changes involving right sternocleidomastoid.  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   MEDICAL HISTORY:  Joel Parsons, is a 64 year old male, accompanied by his wife seen in request by his primary care from [facility], [place] for evaluation of gait abnormality, initial evaluation was on February 19, 2024  History is obtained from the patient and review of electronic medical records. I personally reviewed pertinent available imaging films in PACS.   PMHx of  Plantar fascitis surgery in 2004,   He is a high school teacher, exercise regularly, denies previous gait abnormality, had acute onset of gait difficulty since he fell on August 20, 2023, after workout, he stretched his right leg on the post, lost the balance, fell backwards landed on his right side, he felt a jolt of shooting pain along his spine, he was able to get up, but noticed mild gait abnormality since then, leg would not go as fast, right side is worse  Over past few months, his symptoms continue to get worse,  denies sensory loss, denied significant neck or low back pain, noticed slow worsening urinary urgency, denied bowel incontinence, also develop muscle fasciculation of upper or lower extremity, mild right hand clumsiness, denies swallowing difficulty visual loss  UPDATE Jan 9th 2026: He had extensive imaging study, personally reviewed the film,  MRI of the brain, cervical, thoracic without contrast February 21, 2024, no significant abnormality MRI of lumbar mild degenerative changes most noticeable at L3-4 L4-5, no significant canal or foraminal stenosis  He return for electrodiagnostic study today, which demonstrated widespread active/chronic neuropathic changes involving right cervical, lumbosacral myotomes, mild involvement of right sternocleidomastoid muscle.  There is evidence of active denervation, fasciculations at multiple limb muscle tested.  He has profound hyperreflexia, bilateral Hoffmann and Babinski signs.  PHYSICAL EXAM:   Vitals:   03/01/24 0823  BP: 126/81  Pulse: 75  Resp: 15  SpO2: 97%  Height: 5' 10 (1.778 m)     Body mass index is 27.91 kg/m.  PHYSICAL EXAMNIATION:  Gen: NAD, conversant, well nourised, well groomed                     Cardiovascular: Regular rate rhythm, no peripheral edema, warm, nontender. Eyes: Conjunctivae clear without exudates or hemorrhage Neck: Supple, no carotid bruits. Pulmonary: Clear to auscultation bilaterally   NEUROLOGICAL EXAM:  MENTAL STATUS: Speech/cognition: Awake, alert, oriented to history taking and casual conversation CRANIAL NERVES: CN II: Visual fields are full to confrontation. Pupils are round equal and briskly reactive to light. CN III, IV, VI: extraocular movement are normal.  No ptosis. CN V: Facial sensation is intact to light touch CN VII: Face is symmetric with normal eye closure  CN VIII: Hearing is normal to causal conversation. CN IX, X: Phonation is normal. CN XI: Head turning and shoulder shrug are  intact CN XII: No significant tongue atrophy or fasciculation  MOTOR: Mild bilateral shoulder abduction, external rotation weakness right worse than left, visible right proximal upper extremity muscle fasciculation,  Mild bilateral hip flexion weakness right worse than left   REFLEXES: Reflexes are 3 and symmetric at the biceps, triceps, knees, and ankles. Plantar responses are extensor bilaterally, bilateral Hoffmann sign  SENSORY: Intact to light touch, pinprick and vibratory sensation are intact in fingers and toes.  COORDINATION: There is no trunk or limb dysmetria noted.  GAIT/STANCE: Multiple effort to get up from seated position arm crossed, stiff, profound gait abnormality  REVIEW OF SYSTEMS:  Full 14 system review of systems performed and notable only for as above All other review of systems were negative.   ALLERGIES: Allergies[1]  HOME MEDICATIONS: Current Outpatient Medications  Medication Sig Dispense Refill   ibuprofen (ADVIL,MOTRIN) 200 MG tablet Take 200 mg by mouth every 6 (six) hours as needed.     SODIUM FLUORIDE 5000 PPM 1.1 % PSTE      Vitamin D, Ergocalciferol, (DRISDOL) 1.25 MG (50000 UNIT) CAPS capsule Take 50,000 Units by mouth every 7 (seven) days.     No current facility-administered medications for this visit.    PAST MEDICAL HISTORY: Past Medical History:  Diagnosis Date   Gait abnormality    Lower back pain    Right hip pain     PAST SURGICAL HISTORY: Past Surgical History:  Procedure Laterality Date   FINGER SURGERY     FOOT SURGERY     HERNIA REPAIR      FAMILY HISTORY: Family History  Problem Relation Age of Onset   Hyperlipidemia Father    Dementia Father    Hyperlipidemia Sister     SOCIAL HISTORY: Social History   Socioeconomic History   Marital status: Married    Spouse name: Not on file   Number of children: Not on file   Years of education: Not on file   Highest education level: Not on file  Occupational  History   Not on file  Tobacco Use   Smoking status: Never   Smokeless tobacco: Not on file  Substance and Sexual Activity   Alcohol use: No   Drug use: No   Sexual activity: Not on file  Other Topics Concern   Not on file  Social History Narrative   Not on file   Social Drivers of Health   Tobacco Use: Unknown (03/01/2024)   Patient History    Smoking Tobacco Use: Never    Smokeless Tobacco Use: Unknown    Passive Exposure: Not on file  Financial Resource Strain: Low Risk (12/01/2023)   Received from [facility]   Overall Financial Resource Strain (CARDIA)    How hard is it for you to pay for the very basics like food, housing, medical care, and heating?: Not very hard  Food Insecurity: No Food Insecurity (12/01/2023)   Received from [facility]   Epic    Within the past 12 months, you worried that your food would run out before you got the money to buy more.: Never true    Within the past 12 months, the food you bought just didn't last and you didn't have money to get more.:  Never true  Transportation Needs: No Transportation Needs (12/01/2023)   Received from [facility]    In the past 12 months, has lack of transportation kept you from medical appointments or from getting medications?: No    In the past 12 months, has lack of transportation kept you from meetings, work, or from getting things needed for daily living?: No  Physical Activity: Sufficiently Active (12/01/2023)   Received from [facility]   Exercise Vital Sign    On average, how many days per week do you engage in moderate to strenuous exercise (like a brisk walk)?: 6 days    On average, how many minutes do you engage in exercise at this level?: 60 min  Stress: No Stress Concern Present (12/01/2023)   Received from [facility] - Occupational Stress Questionnaire    Do you feel stress - tense, restless, nervous, or anxious, or unable to sleep at night  because your mind is troubled all the time - these days?: Not at all  Social Connections: Socially Integrated (12/01/2023)   Received from [facility]   Social Network    How would you rate your social network (family, work, friends)?: Good participation with social networks  Intimate Partner Violence: Not At Risk (12/01/2023)   Received from [facility]   HITS    Over the last 12 months how often did your partner physically hurt you?: Never    Over the last 12 months how often did your partner insult you or talk down to you?: Never    Over the last 12 months how often did your partner threaten you with physical harm?: Never    Over the last 12 months how often did your partner scream or curse at you?: Never  Depression (PHQ2-9): Not on file  Alcohol Screen: Not on file  Housing: Low Risk (12/01/2023)   Received from [facility]    In the last 12 months, was there a time when you were not able to pay the mortgage or rent on time?: No    In the past 12 months, how many times have you moved where you were living?: 0    At any time in the past 12 months, were you homeless or living in a shelter (including now)?: No  Utilities: Not At Risk (12/01/2023)   Received from [facility]    In the past 12 months has the electric, gas, oil, or water company threatened to shut off services in your home?: No  Health Literacy: Not on file      Modena Callander, M.D. Ph.D.  [facility] [address] [place], [place] [address] Ph: [phone] Fax: [phone]  CC:  Marygrace [place] LABOR, PA-C [address] [place],  [place] [address]  Mihalovich, [place] LABOR, PA-C       [1] No Known Allergies  "

## 2024-03-01 NOTE — Patient Instructions (Signed)
[facility] Address: [address], [place], [place] [address]  Phone: [phone]

## 2024-03-01 NOTE — Procedures (Addendum)
 "         Full Name: Joel Parsons Gender: Male MRN #: [id number] Date of Birth: June 19, 1960    Visit Date: 03/01/2024 09:07 Age: 64 Years Examining Physician: Onita Duos Referring Physician: Onita Height: 5 feet 9 inch History: 64 year old male presenting with slow Worsening gait abnormality since he fell in June 2025  Summary of the test:  Nerve conduction study: Right sural, superficial peroneal, ulnar sensory responses were normal.  Right median sensory response showed mildly prolonged peak latency.  Right peroneal to EDB, tibial, ulnar motor responses were normal.  Right median motor response was absent  Electromyography: Extensive needle examination were performed at right upper, lower extremity muscles, cervical, lumbosacral paraspinal muscles, right thoracic paraspinal muscles; right sternocleidomastoid and right genioglossus.  There was evidence of chronic/active neuropathic changes involving right cervical, lumbosacral myotomes.  There was also subtle evidence of chronic neuropathic changes involving right bulbar myotomes.  Conclusion: This is an abnormal study.  There is electrodiagnostic evidence of chronic/active neuropathic changes involving right cervical, lumbar sacral, and subtle neuropathic changes involving right bulbar myotomes.  Above findings most support a diagnosis of motor neuron disease.  Differentiation diagnoses also include nutritional deficiency, inflammation, infectious etiology, paraneoplastic syndrome etc.    ------------------------------- Duos Onita, M.D. Ph.D.  [facility] [address] [place], [place] [address] Tel: [phone] Fax: [phone]  Verbal informed consent was obtained from the patient, patient was informed of potential risk of procedure, including bruising, bleeding, hematoma formation, infection, muscle weakness, muscle pain, numbness, among others.        MNC    Nerve / Sites Muscle Latency Ref.  Amplitude Ref. Rel Amp Segments Distance Velocity Ref. Area    ms ms mV mV %  cm m/s m/s mVms  R Median - APB     Wrist APB NR <=4.4 NR >=4.0 NR Wrist - APB 7   NR     Upper arm APB 5.0  0.5   Upper arm - Wrist   >=49 1.3  R Ulnar - ADM     Wrist ADM 3.3 <=3.3 6.9 >=6.0 100 Wrist - ADM 7   20.8     B.Elbow ADM 5.9  5.7  82.8 B.Elbow - Wrist 15 58 >=49 19.3     A.Elbow ADM 8.8  5.1  89.6 A.Elbow - B.Elbow 15 52 >=49 18.2  R Peroneal - EDB     Ankle EDB 6.0 <=6.5 3.1 >=2.0 100 Ankle - EDB 9   8.6     Fib head EDB 13.1  2.8  89 Fib head - Ankle 31 43 >=44 7.7     Pop fossa EDB 15.1  2.7  96.7 Pop fossa - Fib head 9 45 >=44 7.6         Pop fossa - Ankle      R Tibial - AH     Ankle AH 5.4 <=5.8 6.4 >=4.0 100 Ankle - AH 9   19.0     Pop fossa AH 16.7  4.5  70.7 Pop fossa - Ankle 47 41 >=41 18.0             SNC    Nerve / Sites Rec. Site Peak Lat Ref.  Amp Ref. Segments Distance    ms ms V V  cm  R Sural - Ankle (Calf)     Calf Ankle 4.1 <=4.4 9 >=6 Calf - Ankle 14  R Superficial peroneal - Ankle     Lat leg  Ankle 4.4 <=4.4 8 >=6 Lat leg - Ankle 14  R Median - Orthodromic (Dig II, Mid palm)     Dig II Wrist 3.9 <=3.4 11 >=10 Dig II - Wrist 13  R Ulnar - Orthodromic, (Dig V, Mid palm)     Dig V Wrist 3.0 <=3.1 7 >=5 Dig V - Wrist 24             F  Wave    Nerve F Lat Ref.   ms ms  R Ulnar - ADM 30.1 <=32.0  R Tibial - AH 54.3 <=56.0         EMG Summary Table    Spontaneous MUAP Recruitment  Muscle IA Fib PSW Fasc Other Amp Dur. Poly Pattern  R. First dorsal interosseous Increased None None None _______ Normal Normal Normal Reduced  R. Abductor pollicis brevis Increased 2+ 2+ None _______ Increased Increased 1+ Reduced  R. Extensor digitorum communis Increased 1+ None None _______ Increased Increased 1+ Reduced  R. Deltoid Increased 1+ 1+ None _______ Increased Increased 1+ Reduced  R. Biceps brachii Increased 1+ None Occasional _______ Normal Increased 1+ Reduced  R. Triceps  brachii Increased None None Occasional _______ Normal Increased 1+ Reduced  R. Cervical paraspinals Increased None None None _______ Increased Increased 1+ Normal  R. Thoracic paraspinals (mid) Normal None None None _______ Normal Normal Normal Normal  R. Thoracic paraspinals (low) Normal None None None _______ Normal Normal Normal Normal  R. Lumbar paraspinals (mid) Normal None None None _______ Normal Normal Normal Normal  R. Lumbar paraspinals (low) Normal None None None _______ Normal Normal Normal Normal  R. Tibialis anterior Increased 1+ None Rare _______ Increased Increased 1+ Reduced  R. Peroneus longus Increased 1+ None None _______ Increased Increased 1+ Reduced  R. Gastrocnemius (Medial head) Increased 1+ None Rare _______ Increased Increased 1+ Reduced  R. Vastus lateralis Increased None None None _______ Increased Increased 1+ Reduced  R. Sternocleidomastoid Increased None None None _______ Normal Normal Normal Reduced  R. Genioglossus Normal None None None _______ Normal Normal Normal Normal     "

## 2024-03-03 ENCOUNTER — Encounter: Payer: Self-pay | Admitting: Neurology

## 2024-03-04 ENCOUNTER — Telehealth: Payer: Self-pay | Admitting: Neurology

## 2024-03-04 NOTE — Telephone Encounter (Signed)
 Referral for neurology emailed to [email or web address] due to fax is not working properly. [facility]. Phone: [phone], Fax: [phone]

## 2024-03-05 ENCOUNTER — Telehealth: Payer: Self-pay | Admitting: Neurology

## 2024-03-05 NOTE — Telephone Encounter (Signed)
 Dr.Yan please see the below message. I called patient and he states at visit on 03/01/24 he mentioned has issues with sleep and night due to nerve pain. Pt said you told him you would send in Rx to help with his sleep since nerve pain flares up worse at night.    Pharmacy is [facility] in [place]   Please advise

## 2024-03-05 NOTE — Telephone Encounter (Signed)
 Patient said, not sleeping at night due to nerve pain. Would like Dr. Onita to prescribe a medication to help. Can send to  [facility] [phone]

## 2024-03-06 MED ORDER — GABAPENTIN 300 MG PO CAPS: 300.0000 mg | ORAL_CAPSULE | Freq: Three times a day (TID) | ORAL | 6 refills | Status: AC | PRN

## 2024-03-06 NOTE — Telephone Encounter (Signed)
 Meds ordered this encounter  Medications   gabapentin  (NEURONTIN ) 300 MG capsule    Sig: Take 1 capsule (300 mg total) by mouth 3 (three) times daily as needed.    Dispense:  90 capsule    Refill:  6

## 2024-03-06 NOTE — Telephone Encounter (Addendum)
 As a result of pt and wife going out of town tomorrow wife is f/u on the request made yesterday, she is asking that the medication be called in some time today so they have it before they go out of town.  Wife confirmed she is still wanting to use [facility] in [place] . Wife asked this be sent as high priority since they have been waiting since Friday.

## 2024-03-06 NOTE — Telephone Encounter (Signed)
 Please called patient and let him know has been sent in .

## 2024-03-06 NOTE — Telephone Encounter (Signed)
 Dr.Yan patient is calling to follow up, they would like Rx sent today if possible.

## 2024-03-07 LAB — SEDIMENTATION RATE: Sed Rate: 4 mm/h (ref 0–30)

## 2024-03-07 LAB — MULTIPLE MYELOMA PANEL, SERUM
Albumin SerPl Elph-Mcnc: 3.8 g/dL (ref 2.9–4.4)
Albumin/Glob SerPl: 1.2 (ref 0.7–1.7)
Alpha 1: 0.2 g/dL (ref 0.0–0.4)
Alpha2 Glob SerPl Elph-Mcnc: 0.7 g/dL (ref 0.4–1.0)
B-Globulin SerPl Elph-Mcnc: 1.2 g/dL (ref 0.7–1.3)
Gamma Glob SerPl Elph-Mcnc: 1.1 g/dL (ref 0.4–1.8)
Globulin, Total: 3.3 g/dL (ref 2.2–3.9)
IgG (Immunoglobin G), Serum: 1382 mg/dL (ref 603–1613)
IgM (Immunoglobulin M), Srm: 86 mg/dL (ref 20–172)
Immunoglobulin A, (IgA) QN, Serum: 186 mg/dL (ref 61–437)
Total Protein: 7.1 g/dL (ref 6.0–8.5)

## 2024-03-07 LAB — CBC WITH DIFFERENTIAL/PLATELET
Basophils Absolute: 0 x10E3/uL (ref 0.0–0.2)
Basos: 0 %
EOS (ABSOLUTE): 0.2 x10E3/uL (ref 0.0–0.4)
Eos: 2 %
Hematocrit: 49.7 % (ref 37.5–51.0)
Hemoglobin: 16.7 g/dL (ref 13.0–17.7)
Immature Grans (Abs): 0 x10E3/uL (ref 0.0–0.1)
Immature Granulocytes: 0 %
Lymphocytes Absolute: 1.8 x10E3/uL (ref 0.7–3.1)
Lymphs: 25 %
MCH: 30.3 pg (ref 26.6–33.0)
MCHC: 33.6 g/dL (ref 31.5–35.7)
MCV: 90 fL (ref 79–97)
Monocytes Absolute: 0.6 x10E3/uL (ref 0.1–0.9)
Monocytes: 8 %
Neutrophils Absolute: 4.5 x10E3/uL (ref 1.4–7.0)
Neutrophils: 65 %
Platelets: 231 x10E3/uL (ref 150–450)
RBC: 5.51 x10E6/uL (ref 4.14–5.80)
RDW: 13.1 % (ref 11.6–15.4)
WBC: 7.1 x10E3/uL (ref 3.4–10.8)

## 2024-03-07 LAB — SYPHILIS: RPR W/REFLEX TO RPR TITER AND TREPONEMAL ANTIBODIES, TRADITIONAL SCREENING AND DIAGNOSIS ALGORITHM: RPR Ser Ql: NONREACTIVE

## 2024-03-07 LAB — HEAVY METALS, BLOOD
Arsenic: 1 ug/L (ref 0–9)
Lead, Blood: <1 ug/dL (ref 0.0–3.4)
Mercury: <1 ug/L (ref 0.0–14.9)

## 2024-03-07 LAB — ANA W/REFLEX IF POSITIVE: Anti Nuclear Antibody (ANA): NEGATIVE

## 2024-03-07 LAB — TSH: TSH: 1.93 u[IU]/mL (ref 0.450–4.500)

## 2024-03-07 LAB — COPPER, SERUM: Copper: 91 ug/dL (ref 69–132)

## 2024-03-07 LAB — FOLATE: Folate: >20 ng/mL

## 2024-03-07 LAB — CK: Total CK: 286 U/L (ref 41–331)

## 2024-03-07 LAB — HIV ANTIBODY (ROUTINE TESTING W REFLEX): HIV Screen 4th Generation wRfx: NONREACTIVE

## 2024-03-07 LAB — VITAMIN B1: Thiamine: 140.2 nmol/L (ref 66.5–200.0)

## 2024-03-07 LAB — C-REACTIVE PROTEIN: CRP: 2 mg/L (ref 0–10)

## 2024-03-07 LAB — FERRITIN: Ferritin: 92 ng/mL (ref 30–400)

## 2024-03-07 LAB — ANGIOTENSIN CONVERTING ENZYME: Angio Convert Enzyme: 43 U/L (ref 14–82)

## 2024-03-07 NOTE — Telephone Encounter (Signed)
 Called Pt ,No answer , LVM  Medication was filled

## 2024-03-12 ENCOUNTER — Ambulatory Visit (INDEPENDENT_AMBULATORY_CARE_PROVIDER_SITE_OTHER): Payer: Self-pay | Admitting: Neurology

## 2024-03-12 DIAGNOSIS — G1220 Motor neuron disease, unspecified: Secondary | ICD-10-CM

## 2024-03-14 NOTE — Telephone Encounter (Signed)
 Called patient he states the swelling has came down and he doesn't feel the need to come in. He appreciates the call to follow up.

## 2024-03-14 NOTE — Telephone Encounter (Signed)
 Please call patient, if he still has swelling and pain at elbow, you may let him come in today for me to check on him, any time would be ok , I will see him between patients.

## 2024-03-15 ENCOUNTER — Encounter (HOSPITAL_COMMUNITY): Payer: Self-pay | Admitting: *Deleted

## 2024-03-15 ENCOUNTER — Telehealth: Payer: Self-pay | Admitting: Neurology

## 2024-03-15 ENCOUNTER — Other Ambulatory Visit: Payer: Self-pay

## 2024-03-15 ENCOUNTER — Emergency Department (HOSPITAL_COMMUNITY)
Admission: EM | Admit: 2024-03-15 | Discharge: 2024-03-15 | Disposition: A | Discharge status: Home / Self Care | Attending: Emergency Medicine | Admitting: Emergency Medicine

## 2024-03-15 ENCOUNTER — Emergency Department (HOSPITAL_COMMUNITY)

## 2024-03-15 ENCOUNTER — Ambulatory Visit
Admission: RE | Admit: 2024-03-15 | Discharge: 2024-03-15 | Disposition: A | Payer: Self-pay | Source: Ambulatory Visit | Attending: Neurology | Admitting: Neurology

## 2024-03-15 DIAGNOSIS — G1220 Motor neuron disease, unspecified: Secondary | ICD-10-CM

## 2024-03-15 DIAGNOSIS — R531 Weakness: Secondary | ICD-10-CM | POA: Diagnosis present

## 2024-03-15 DIAGNOSIS — R3915 Urgency of urination: Secondary | ICD-10-CM

## 2024-03-15 DIAGNOSIS — M7989 Other specified soft tissue disorders: Secondary | ICD-10-CM

## 2024-03-15 DIAGNOSIS — I2693 Single subsegmental pulmonary embolism without acute cor pulmonale: Secondary | ICD-10-CM | POA: Diagnosis not present

## 2024-03-15 DIAGNOSIS — Z7901 Long term (current) use of anticoagulants: Secondary | ICD-10-CM | POA: Insufficient documentation

## 2024-03-15 DIAGNOSIS — W19XXXS Unspecified fall, sequela: Secondary | ICD-10-CM

## 2024-03-15 DIAGNOSIS — R269 Unspecified abnormalities of gait and mobility: Secondary | ICD-10-CM

## 2024-03-15 LAB — BASIC METABOLIC PANEL WITH GFR
Anion gap: 9 (ref 5–15)
BUN: 13 mg/dL (ref 8–23)
CO2: 23 mmol/L (ref 22–32)
Calcium: 9.2 mg/dL (ref 8.9–10.3)
Chloride: 104 mmol/L (ref 98–111)
Creatinine, Ser: 0.88 mg/dL (ref 0.61–1.24)
GFR, Estimated: >60 mL/min
Glucose, Bld: 98 mg/dL (ref 70–99)
Potassium: 4.2 mmol/L (ref 3.5–5.1)
Sodium: 136 mmol/L (ref 135–145)

## 2024-03-15 LAB — CBC
HCT: 44 % (ref 39.0–52.0)
Hemoglobin: 15.3 g/dL (ref 13.0–17.0)
MCH: 30.5 pg (ref 26.0–34.0)
MCHC: 34.8 g/dL (ref 30.0–36.0)
MCV: 87.8 fL (ref 80.0–100.0)
Platelets: 227 K/uL (ref 150–400)
RBC: 5.01 MIL/uL (ref 4.22–5.81)
RDW: 13 % (ref 11.5–15.5)
WBC: 7.3 K/uL (ref 4.0–10.5)
nRBC: 0 % (ref 0.0–0.2)

## 2024-03-15 LAB — TROPONIN T, HIGH SENSITIVITY: Troponin T High Sensitivity: 9 ng/L (ref 0–19)

## 2024-03-15 MED ORDER — IOPAMIDOL (ISOVUE-300) INJECTION 61%
100.0000 mL | Freq: Once | INTRAVENOUS | Status: AC | PRN
  Administered 2024-03-15: 100 mL via INTRAVENOUS

## 2024-03-15 MED ORDER — ACETAMINOPHEN 500 MG PO TABS
1000.0000 mg | ORAL_TABLET | Freq: Once | ORAL | Status: AC
  Administered 2024-03-15: 1000 mg via ORAL
  Filled 2024-03-15: qty 2

## 2024-03-15 MED ORDER — APIXABAN 5 MG PO TABS
10.0000 mg | ORAL_TABLET | Freq: Once | ORAL | Status: AC
  Administered 2024-03-15: 10 mg via ORAL
  Filled 2024-03-15: qty 2

## 2024-03-15 MED ORDER — APIXABAN 5 MG PO TABS: ORAL_TABLET | ORAL | 0 refills | Status: AC

## 2024-03-15 MED ORDER — APIXABAN (ELIQUIS) EDUCATION KIT FOR DVT/PE PATIENTS
PACK | Freq: Once | Status: AC
  Filled 2024-03-15: qty 1

## 2024-03-15 NOTE — Telephone Encounter (Signed)
 Dr Onita,   Motor neuron Disease Patient was today at [place] [facility],  The radiologist called for abnormal test results:  right lower lob pulmonary embolus was seen on CT scan .  Radiologist informed me that patient is already back at home,  I called him and ask him to go to the ER.  He will go to [place] ed.  His wife is also working at [employer] and is here today , he will inform her so he has a ride home if needed.  I called the triage nurse and the ED MD to announce this patient's arrival.      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, had acute onset of gait difficulty with a fall he took on August 20, 2023, after workout, he stretched his right leg on the post, lost the balance, fell backwards landed on his right side, he felt a jolt of shooting pain along his spine, he was able to get up, but noticed mild gait abnormality since then, leg would not go as fast, right side is worse   Over past few months, his symptoms continued to worsen, he denies sensory loss, denied significant neck or low back pain, noticed slow worsening urinary urgency, denied bowel incontinence, also developed muscle fasciculation of upper and lower extremity, mild right hand clumsiness,  denies swallowing difficulty visual loss

## 2024-03-15 NOTE — ED Notes (Signed)
 Ultrasound at bedside

## 2024-03-15 NOTE — ED Provider Notes (Signed)
 " [facility] Provider Note   CSN: [id number] Arrival date & time: 03/15/24  1525     Patient presents with: No chief complaint on file.   Joel Parsons is a 64 y.o. male.   Pt is a 64 yo male with pmhx significant for recently diagnosed ALS.  He had a ct scan of his chest and abdomen/pelvis and an incidental small PE was seen on CT.  Pt denies sob or cp.  Pt's wife said they recently took a long flight from [place] .       Prior to Admission medications  Medication Sig Start Date End Date Taking? Authorizing Provider  apixaban (ELIQUIS) 5 MG TABS tablet Take 2 tablets (10mg ) twice daily for 7 days, then 1 tablet (5mg ) twice daily 03/15/24  Yes Dean Clarity, MD  gabapentin  (NEURONTIN ) 300 MG capsule Take 1 capsule (300 mg total) by mouth 3 (three) times daily as needed. 03/06/24   Onita Duos, MD  ibuprofen (ADVIL,MOTRIN) 200 MG tablet Take 200 mg by mouth every 6 (six) hours as needed.    [provider]  SODIUM FLUORIDE 5000 PPM 1.1 % PSTE  10/10/23   [provider]  Vitamin D, Ergocalciferol, (DRISDOL) 1.25 MG (50000 UNIT) CAPS capsule Take 50,000 Units by mouth every 7 (seven) days. 01/24/24   [provider]    Allergies: Patient has no known allergies.    Review of Systems  Neurological:  Positive for weakness.  All other systems reviewed and are negative.   Updated Vital Signs BP (!) 134/90   Pulse 66   Temp (!) 97.4 F (36.3 C)   Resp 19   Ht 5' 10 (1.778 m)   Wt 88.2 kg   SpO2 100%   BMI 27.90 kg/m   Physical Exam Vitals reviewed.  Constitutional:      Appearance: Normal appearance.  HENT:     Head: Normocephalic and atraumatic.     Right Ear: External ear normal.     Left Ear: External ear normal.     Nose: Nose normal.     Mouth/Throat:     Mouth: Mucous membranes are moist.     Pharynx: Oropharynx is clear.  Eyes:     Extraocular Movements: Extraocular movements intact.      Conjunctiva/sclera: Conjunctivae normal.     Pupils: Pupils are equal, round, and reactive to light.  Cardiovascular:     Rate and Rhythm: Normal rate and regular rhythm.     Pulses: Normal pulses.     Heart sounds: Normal heart sounds.  Pulmonary:     Effort: Pulmonary effort is normal.     Breath sounds: Normal breath sounds.  Abdominal:     General: Abdomen is flat. Bowel sounds are normal.     Palpations: Abdomen is soft.  Musculoskeletal:        General: Normal range of motion.     Cervical back: Normal range of motion and neck supple.  Skin:    General: Skin is warm.     Capillary Refill: Capillary refill takes less than 2 seconds.  Neurological:     General: No focal deficit present.     Mental Status: He is alert and oriented to person, place, and time.     Comments: Generalized weakness  Psychiatric:        Mood and Affect: Mood normal.        Behavior: Behavior normal.     (all labs ordered are  listed, but only abnormal results are displayed) Labs Reviewed  BASIC METABOLIC PANEL WITH GFR  CBC  TROPONIN T, HIGH SENSITIVITY  TROPONIN T, HIGH SENSITIVITY    EKG: EKG Interpretation Date/Time:  Friday March 15 2024 16:04:44 EST Ventricular Rate:  68 PR Interval:  163 QRS Duration:  91 QT Interval:  369 QTC Calculation: 393 R Axis:   3  Text Interpretation: Sinus rhythm No old tracing to compare Confirmed by Dean Clarity [phone]) on 03/15/2024 5:34:06 PM  Radiology: VAS US  LOWER EXTREMITY VENOUS (DVT) (ONLY [facility] & [facility]) Result Date: 03/15/2024  Lower Venous DVT Study Patient Name:  Joel Parsons  Date of Exam:   03/15/2024 Medical Rec #: [id number]      Accession #:    [id number] Date of Birth: 05/02/1960      Patient Gender: M Patient Age:   16 years Exam Location:  [facility] Procedure:      VAS US  LOWER EXTREMITY VENOUS (DVT) Referring Phys: Sehaj Kolden --------------------------------------------------------------------------------  Indications:  Pulmonary embolism.  Comparison Study: No previous exams Performing Technologist: Jody Hill RVT, RDMS  Examination Guidelines: A complete evaluation includes B-mode imaging, spectral Doppler, color Doppler, and power Doppler as needed of all accessible portions of each vessel. Bilateral testing is considered an integral part of a complete examination. Limited examinations for reoccurring indications may be performed as noted. The reflux portion of the exam is performed with the patient in reverse Trendelenburg.  +---------+---------------+---------+-----------+----------+--------------+ RIGHT    CompressibilityPhasicitySpontaneityPropertiesThrombus Aging +---------+---------------+---------+-----------+----------+--------------+ CFV      Full           Yes      Yes                                 +---------+---------------+---------+-----------+----------+--------------+ SFJ      Full                                                        +---------+---------------+---------+-----------+----------+--------------+ FV Prox  Full           Yes      Yes                                 +---------+---------------+---------+-----------+----------+--------------+ FV Mid   Full           Yes      Yes                                 +---------+---------------+---------+-----------+----------+--------------+ FV DistalFull           Yes      Yes                                 +---------+---------------+---------+-----------+----------+--------------+ PFV      Full                                                        +---------+---------------+---------+-----------+----------+--------------+  POP      Full           Yes      Yes                                 +---------+---------------+---------+-----------+----------+--------------+ PTV      Full                                                         +---------+---------------+---------+-----------+----------+--------------+ PERO     Full                                                        +---------+---------------+---------+-----------+----------+--------------+   +---------+---------------+---------+-----------+----------+--------------+ LEFT     CompressibilityPhasicitySpontaneityPropertiesThrombus Aging +---------+---------------+---------+-----------+----------+--------------+ CFV      Full           Yes      Yes                                 +---------+---------------+---------+-----------+----------+--------------+ SFJ      Full                                                        +---------+---------------+---------+-----------+----------+--------------+ FV Prox  Full           Yes      Yes                                 +---------+---------------+---------+-----------+----------+--------------+ FV Mid   Full           Yes      Yes                                 +---------+---------------+---------+-----------+----------+--------------+ FV DistalFull           Yes      Yes                                 +---------+---------------+---------+-----------+----------+--------------+ PFV      Full                                                        +---------+---------------+---------+-----------+----------+--------------+ POP      Full           Yes      Yes                                 +---------+---------------+---------+-----------+----------+--------------+ PTV  Full                                                        +---------+---------------+---------+-----------+----------+--------------+ PERO     Full                                                        +---------+---------------+---------+-----------+----------+--------------+    Summary: BILATERAL: - No evidence of deep vein thrombosis seen in the lower extremities, bilaterally. -No evidence of  popliteal cyst, bilaterally.   *See table(s) above for measurements and observations.    Preliminary    CT CHEST ABDOMEN PELVIS W CONTRAST Result Date: 03/15/2024 EXAM: CT CHEST, ABDOMEN AND PELVIS WITH CONTRAST 03/15/2024 01:35:13 PM TECHNIQUE: CT of the chest, abdomen and pelvis was performed with the administration of 100 mL of iopamidol (ISOVUE-300) 61% injection. Multiplanar reformatted images are provided for review. Automated exposure control, iterative reconstruction, and/or weight based adjustment of the mA/kV was utilized to reduce the radiation dose to as low as reasonably achievable. COMPARISON: None available. CLINICAL HISTORY: Unintended weight loss. FINDINGS: CHEST: MEDIASTINUM AND LYMPH NODES: Heart and pericardium are unremarkable. The central airways are clear. No mediastinal, hilar or axillary lymphadenopathy. LUNGS AND PLEURA: Small burden pulmonary embolism in the right interlobar pulmonary artery extending into the superior segment and basilar segmental branches of the right lower lobe. No findings of right heart strain. 3 mm nodule in the right lower lobe (axial 71), likely infectious or inflammatory. No focal consolidation or pulmonary edema. No pleural effusion. No pneumothorax. ABDOMEN AND PELVIS: LIVER: Subcentimeter hypodensity in the right lobe of the liver, too small to definitively characterize common but likely a small cyst or biliary hematoma. GALLBLADDER AND BILE DUCTS: Unremarkable. No biliary ductal dilatation. SPLEEN: No acute abnormality. PANCREAS: No acute abnormality. ADRENAL GLANDS: No acute abnormality. KIDNEYS, URETERS AND BLADDER: 1.3 cm left upper pole renal cyst. 1.5 cm right interpolar region cyst. Per consensus, no follow-up is needed for simple Bosniak type 1 and 2 renal cysts, unless the patient has a malignancy history or risk factors. Small nonobstructive right lower pole calyceal calculus. No stones in the ureters. No hydronephrosis. No perinephric or  periureteral stranding. Urinary bladder is unremarkable. GI AND BOWEL: Decompressed stomach containing enteric contrast. There is also contrast present throughout the small bowel. No extravasation of enteric contrast to suggest bowel perforation. There is no bowel obstruction. REPRODUCTIVE ORGANS: No prostatomegaly. No acute abnormality. PERITONEUM AND RETROPERITONEUM: No ascites. No free air. No free pelvic fluid. VASCULATURE: Aorta is normal in caliber. ABDOMINAL AND PELVIS LYMPH NODES: No lymphadenopathy. BONES AND SOFT TISSUES: Small volume symmetric gynecomastia. Multilevel degenerative disc disease of the spine. No acute osseous abnormality. Critical value/emergent results were called by telephone at the time of interpretation on March 15, 2024 at 2:48 pm provider, Dr. Chalice. A follow up call also occurred with Yuijun Yan Medical Doctor at 2:53 pm and both physicians verbally acknowledged these results. IMPRESSION: 1. Small burden pulmonary embolism in the right interlobar pulmonary artery extending into the superior segment and basilar segmental branches of the right lower lobe. No findings of right heart strain. 2. Critical value and emergent results were  called by telephone at the time of interpretation on March 15, 2024, at 2:48 pm to Dr. Chalice, and a follow-up call also occurred with Dr. Marvell Callander, at 2:53 pm, and both physicians verbally acknowledged these results. Electronically signed by: Rogelia Myers MD 03/15/2024 03:00 PM EST RP Workstation: [id number]     Procedures   Medications Ordered in the ED  acetaminophen (TYLENOL) tablet 1,000 mg (has no administration in time range)  apixaban (ELIQUIS) tablet 10 mg (has no administration in time range)  apixaban (ELIQUIS) Education Kit for DVT/PE patients (has no administration in time range)                                    Medical Decision Making Amount and/or Complexity of Data Reviewed Labs: ordered.  Risk OTC  drugs. Prescription drug management.   This patient presents to the ED for concern of PE seen on CT, this involves an extensive number of treatment options, and is a complaint that carries with it a high risk of complications and morbidity.  The differential diagnosis includes PE, DVT, heart strain   Co morbidities that complicate the patient evaluation  ALS   Additional history obtained:  Additional history obtained from epic chart review External records from outside source obtained and reviewed including wife   Lab Tests:  I Ordered, and personally interpreted labs.  The pertinent results include:  cbc nl, bmp nl, trop nl   Imaging Studies ordered:  I ordered imaging studies including US  LE  I independently visualized and interpreted imaging which showed neg for DVT I agree with the radiologist interpretation CT scan reviewed by me: . Small burden pulmonary embolism in the right interlobar pulmonary artery extending into the superior segment and basilar segmental branches of the right lower lobe. No findings of right heart strain.   Cardiac Monitoring:  The patient was maintained on a cardiac monitor.  I personally viewed and interpreted the cardiac monitored which showed an underlying rhythm of: nsr   Medicines ordered and prescription drug management:  I ordered medication including eliquis  for PE  Reevaluation of the patient after these medicines showed that the patient improved I have reviewed the patients home medicines and have made adjustments as needed   Test Considered:  ct  Problem List / ED Course:  PE:  small.  No evidence of heart strain.  Pt is not hypoxic.  PE likely provoked from recent flight.  Pt is started on Eliquis.  He is more stable now that he's gotten poles to help him walk.  He and his wife understand that blood thinners can be risky in someone who falls.  At this point, I think the benefits outweigh the risk.      Reevaluation:  After the interventions noted above, I reevaluated the patient and found that they have :improved   Social Determinants of Health:  Lives at home   Dispostion:  After consideration of the diagnostic results and the patients response to treatment, I feel that the patent would benefit from discharge with outpatient f/u.       Final diagnoses:  Single subsegmental pulmonary embolism without acute cor pulmonale [facility])    ED Discharge Orders          Ordered    apixaban (ELIQUIS) 5 MG TABS tablet        03/15/24 1738  Dean Clarity, MD 03/15/24 1738  "

## 2024-03-15 NOTE — ED Triage Notes (Signed)
 Pt sent here from home after an incidental finding of a PE on a ct scan , pt has had some slight cp no sob . Recent dx of ALS

## 2024-03-15 NOTE — Telephone Encounter (Signed)
 Narrative & Impression   I got emergency call from radiologist for right pulmonary artery embolism. He is on his way to ED  IMPRESSION: 1. Small burden pulmonary embolism in the right interlobar pulmonary artery extending into the superior segment and basilar segmental branches of the right lower lobe. No findings of right heart strain. 2. Critical value and emergent results were called by telephone at the time of interpretation on March 15, 2024, at 2:48 pm to Dr. Chalice, and a follow-up call also occurred with Dr. Marvell Callander, at 2:53 pm, and both physicians verbally acknowledged these results.   Electronically signed by: Rogelia Myers MD 03/15/2024 03:00 PM EST RP Workstation: [id number]

## 2024-03-20 MED ORDER — RILUZOLE 50 MG PO TABS: 50.0000 mg | ORAL_TABLET | Freq: Two times a day (BID) | ORAL | 11 refills | Status: AC

## 2024-03-20 NOTE — Telephone Encounter (Signed)
 Please see the MyChart message reply(ies) for my assessment and plan.    This patient gave consent for this Medical Advice Message and is aware that it may result in a bill to [employer], as well as the possibility of receiving a bill for a co-payment or deductible. They are an established patient, but are not seeking medical advice exclusively about a problem treated during an in person or video visit in the last seven days. I did not recommend an in person or video visit within seven days of my reply.    I spent a total of 5 minutes cumulative time within 7 days through [employer].  Levert Feinstein, MD

## 2024-03-20 NOTE — Addendum Note (Signed)
 Addended by: Kenady Doxtater on: 03/20/2024 02:34 PM   Modules accepted: Orders
